# Patient Record
Sex: Female | Born: 1945 | Race: Black or African American | Hispanic: No | Smoking: Never smoker
Health system: Southern US, Community
[De-identification: ages and names within clinical notes are randomized; demographics above are authoritative.]

## PROBLEM LIST (undated history)

## (undated) DIAGNOSIS — J45909 Unspecified asthma, uncomplicated: Secondary | ICD-10-CM

## (undated) DIAGNOSIS — E119 Type 2 diabetes mellitus without complications: Secondary | ICD-10-CM

## (undated) DIAGNOSIS — I10 Essential (primary) hypertension: Secondary | ICD-10-CM

## (undated) DIAGNOSIS — G4730 Sleep apnea, unspecified: Secondary | ICD-10-CM

## (undated) DIAGNOSIS — M1990 Unspecified osteoarthritis, unspecified site: Secondary | ICD-10-CM

## (undated) HISTORY — PX: OTHER SURGICAL HISTORY: SHX169

---

## 2018-12-31 ENCOUNTER — Other Ambulatory Visit: Payer: Self-pay

## 2018-12-31 ENCOUNTER — Emergency Department (HOSPITAL_COMMUNITY): Payer: Medicare HMO

## 2018-12-31 ENCOUNTER — Inpatient Hospital Stay (HOSPITAL_COMMUNITY): Payer: Medicare HMO

## 2018-12-31 ENCOUNTER — Inpatient Hospital Stay (HOSPITAL_COMMUNITY)
Admission: EM | Admit: 2018-12-31 | Discharge: 2019-01-05 | DRG: 100 | Disposition: A | Discharge status: Home Health | Payer: Medicare HMO | Attending: Internal Medicine | Admitting: Internal Medicine

## 2018-12-31 DIAGNOSIS — I161 Hypertensive emergency: Secondary | ICD-10-CM

## 2018-12-31 DIAGNOSIS — G8194 Hemiplegia, unspecified affecting left nondominant side: Secondary | ICD-10-CM | POA: Diagnosis present

## 2018-12-31 DIAGNOSIS — I130 Hypertensive heart and chronic kidney disease with heart failure and stage 1 through stage 4 chronic kidney disease, or unspecified chronic kidney disease: Secondary | ICD-10-CM | POA: Diagnosis present

## 2018-12-31 DIAGNOSIS — R569 Unspecified convulsions: Secondary | ICD-10-CM | POA: Diagnosis not present

## 2018-12-31 DIAGNOSIS — E876 Hypokalemia: Secondary | ICD-10-CM | POA: Diagnosis present

## 2018-12-31 DIAGNOSIS — I5032 Chronic diastolic (congestive) heart failure: Secondary | ICD-10-CM | POA: Diagnosis present

## 2018-12-31 DIAGNOSIS — J9601 Acute respiratory failure with hypoxia: Secondary | ICD-10-CM | POA: Diagnosis present

## 2018-12-31 DIAGNOSIS — R531 Weakness: Secondary | ICD-10-CM

## 2018-12-31 DIAGNOSIS — Z9911 Dependence on respirator [ventilator] status: Secondary | ICD-10-CM

## 2018-12-31 DIAGNOSIS — D320 Benign neoplasm of cerebral meninges: Secondary | ICD-10-CM | POA: Diagnosis present

## 2018-12-31 DIAGNOSIS — J45909 Unspecified asthma, uncomplicated: Secondary | ICD-10-CM | POA: Diagnosis present

## 2018-12-31 DIAGNOSIS — G9341 Metabolic encephalopathy: Secondary | ICD-10-CM | POA: Diagnosis present

## 2018-12-31 DIAGNOSIS — G8384 Todd's paralysis (postepileptic): Secondary | ICD-10-CM | POA: Diagnosis present

## 2018-12-31 DIAGNOSIS — J690 Pneumonitis due to inhalation of food and vomit: Secondary | ICD-10-CM | POA: Diagnosis present

## 2018-12-31 DIAGNOSIS — M109 Gout, unspecified: Secondary | ICD-10-CM | POA: Diagnosis present

## 2018-12-31 DIAGNOSIS — E785 Hyperlipidemia, unspecified: Secondary | ICD-10-CM | POA: Diagnosis present

## 2018-12-31 DIAGNOSIS — N179 Acute kidney failure, unspecified: Secondary | ICD-10-CM | POA: Diagnosis present

## 2018-12-31 DIAGNOSIS — E1122 Type 2 diabetes mellitus with diabetic chronic kidney disease: Secondary | ICD-10-CM | POA: Diagnosis present

## 2018-12-31 DIAGNOSIS — J9811 Atelectasis: Secondary | ICD-10-CM | POA: Diagnosis present

## 2018-12-31 DIAGNOSIS — R404 Transient alteration of awareness: Secondary | ICD-10-CM | POA: Diagnosis not present

## 2018-12-31 DIAGNOSIS — Z20828 Contact with and (suspected) exposure to other viral communicable diseases: Secondary | ICD-10-CM | POA: Diagnosis present

## 2018-12-31 DIAGNOSIS — Z6841 Body Mass Index (BMI) 40.0 and over, adult: Secondary | ICD-10-CM

## 2018-12-31 DIAGNOSIS — R4182 Altered mental status, unspecified: Secondary | ICD-10-CM | POA: Diagnosis not present

## 2018-12-31 DIAGNOSIS — G9340 Encephalopathy, unspecified: Secondary | ICD-10-CM | POA: Diagnosis not present

## 2018-12-31 DIAGNOSIS — N183 Chronic kidney disease, stage 3 (moderate): Secondary | ICD-10-CM | POA: Diagnosis present

## 2018-12-31 DIAGNOSIS — E1165 Type 2 diabetes mellitus with hyperglycemia: Secondary | ICD-10-CM | POA: Diagnosis present

## 2018-12-31 DIAGNOSIS — G4733 Obstructive sleep apnea (adult) (pediatric): Secondary | ICD-10-CM | POA: Diagnosis present

## 2018-12-31 DIAGNOSIS — Z888 Allergy status to other drugs, medicaments and biological substances status: Secondary | ICD-10-CM

## 2018-12-31 DIAGNOSIS — E11649 Type 2 diabetes mellitus with hypoglycemia without coma: Secondary | ICD-10-CM | POA: Diagnosis not present

## 2018-12-31 DIAGNOSIS — Z720 Tobacco use: Secondary | ICD-10-CM

## 2018-12-31 DIAGNOSIS — I10 Essential (primary) hypertension: Secondary | ICD-10-CM

## 2018-12-31 DIAGNOSIS — G40901 Epilepsy, unspecified, not intractable, with status epilepticus: Principal | ICD-10-CM | POA: Diagnosis present

## 2018-12-31 HISTORY — DX: Unspecified asthma, uncomplicated: J45.909

## 2018-12-31 HISTORY — DX: Sleep apnea, unspecified: G47.30

## 2018-12-31 HISTORY — DX: Essential (primary) hypertension: I10

## 2018-12-31 HISTORY — DX: Unspecified osteoarthritis, unspecified site: M19.90

## 2018-12-31 HISTORY — DX: Type 2 diabetes mellitus without complications: E11.9

## 2018-12-31 LAB — I-STAT CHEM 8, ED
BUN: 18 mg/dL (ref 8–23)
Calcium, Ion: 1.14 mmol/L — ABNORMAL LOW (ref 1.15–1.40)
Chloride: 98 mmol/L (ref 98–111)
Creatinine, Ser: 1.4 mg/dL — ABNORMAL HIGH (ref 0.44–1.00)
Glucose, Bld: 336 mg/dL — ABNORMAL HIGH (ref 70–99)
HCT: 42 % (ref 36.0–46.0)
Hemoglobin: 14.3 g/dL (ref 12.0–15.0)
Potassium: 3.6 mmol/L (ref 3.5–5.1)
Sodium: 138 mmol/L (ref 135–145)
TCO2: 30 mmol/L (ref 22–32)

## 2018-12-31 LAB — COMPREHENSIVE METABOLIC PANEL
ALT: 16 U/L (ref 0–44)
AST: 22 U/L (ref 15–41)
Albumin: 3.7 g/dL (ref 3.5–5.0)
Alkaline Phosphatase: 98 U/L (ref 38–126)
Anion gap: 14 (ref 5–15)
BUN: 15 mg/dL (ref 8–23)
CO2: 27 mmol/L (ref 22–32)
Calcium: 9.2 mg/dL (ref 8.9–10.3)
Chloride: 97 mmol/L — ABNORMAL LOW (ref 98–111)
Creatinine, Ser: 1.57 mg/dL — ABNORMAL HIGH (ref 0.44–1.00)
GFR calc Af Amer: 38 mL/min — ABNORMAL LOW (ref >60)
GFR calc non Af Amer: 32 mL/min — ABNORMAL LOW (ref >60)
Glucose, Bld: 335 mg/dL — ABNORMAL HIGH (ref 70–99)
Potassium: 3.7 mmol/L (ref 3.5–5.1)
Sodium: 138 mmol/L (ref 135–145)
Total Bilirubin: 0.6 mg/dL (ref 0.3–1.2)
Total Protein: 8.1 g/dL (ref 6.5–8.1)

## 2018-12-31 LAB — POCT I-STAT 7, (LYTES, BLD GAS, ICA,H+H)
Acid-Base Excess: 3 mmol/L — ABNORMAL HIGH (ref 0.0–2.0)
Acid-base deficit: 1 mmol/L (ref 0.0–2.0)
Bicarbonate: 29.4 mmol/L — ABNORMAL HIGH (ref 20.0–28.0)
Bicarbonate: 28.6 mmol/L — ABNORMAL HIGH (ref 20.0–28.0)
Calcium, Ion: 1.19 mmol/L (ref 1.15–1.40)
Calcium, Ion: 1.16 mmol/L (ref 1.15–1.40)
HCT: 39 % (ref 36.0–46.0)
HCT: 35 % — ABNORMAL LOW (ref 36.0–46.0)
Hemoglobin: 13.3 g/dL (ref 12.0–15.0)
Hemoglobin: 11.9 g/dL — ABNORMAL LOW (ref 12.0–15.0)
O2 Saturation: 92 %
O2 Saturation: 95 %
Patient temperature: 99.7
Potassium: 4.7 mmol/L (ref 3.5–5.1)
Potassium: 3.9 mmol/L (ref 3.5–5.1)
Sodium: 139 mmol/L (ref 135–145)
Sodium: 140 mmol/L (ref 135–145)
TCO2: 32 mmol/L (ref 22–32)
TCO2: 30 mmol/L (ref 22–32)
pCO2 arterial: 75.4 mmHg (ref 32.0–48.0)
pCO2 arterial: 50.6 mmHg — ABNORMAL HIGH (ref 32.0–48.0)
pH, Arterial: 7.199 — CL (ref 7.350–7.450)
pH, Arterial: 7.364 (ref 7.350–7.450)
pO2, Arterial: 82 mmHg — ABNORMAL LOW (ref 83.0–108.0)
pO2, Arterial: 85 mmHg (ref 83.0–108.0)

## 2018-12-31 LAB — DIFFERENTIAL
Abs Immature Granulocytes: 0.06 10*3/uL (ref 0.00–0.07)
Basophils Absolute: 0.1 10*3/uL (ref 0.0–0.1)
Basophils Relative: 0 %
Eosinophils Absolute: 0.1 10*3/uL (ref 0.0–0.5)
Eosinophils Relative: 1 %
Immature Granulocytes: 1 %
Lymphocytes Relative: 26 %
Lymphs Abs: 3.4 10*3/uL (ref 0.7–4.0)
Monocytes Absolute: 0.6 10*3/uL (ref 0.1–1.0)
Monocytes Relative: 4 %
Neutro Abs: 9 10*3/uL — ABNORMAL HIGH (ref 1.7–7.7)
Neutrophils Relative %: 68 %

## 2018-12-31 LAB — MRSA PCR SCREENING: MRSA by PCR: NEGATIVE

## 2018-12-31 LAB — CBC
HCT: 40.6 % (ref 36.0–46.0)
Hemoglobin: 12.3 g/dL (ref 12.0–15.0)
MCH: 25.7 pg — ABNORMAL LOW (ref 26.0–34.0)
MCHC: 30.3 g/dL (ref 30.0–36.0)
MCV: 84.8 fL (ref 80.0–100.0)
Platelets: 227 10*3/uL (ref 150–400)
RBC: 4.79 MIL/uL (ref 3.87–5.11)
RDW: 14 % (ref 11.5–15.5)
WBC: 13.2 10*3/uL — ABNORMAL HIGH (ref 4.0–10.5)
nRBC: 0 % (ref 0.0–0.2)

## 2018-12-31 LAB — PROTIME-INR
INR: 1.1 (ref 0.8–1.2)
Prothrombin Time: 13.8 seconds (ref 11.4–15.2)

## 2018-12-31 LAB — URINALYSIS, ROUTINE W REFLEX MICROSCOPIC
Bilirubin Urine: NEGATIVE
Glucose, UA: >=500 mg/dL — AB
Ketones, ur: 5 mg/dL — AB
Nitrite: NEGATIVE
Protein, ur: 100 mg/dL — AB
Specific Gravity, Urine: 1.012 (ref 1.005–1.030)
pH: 6 (ref 5.0–8.0)

## 2018-12-31 LAB — RAPID URINE DRUG SCREEN, HOSP PERFORMED
Amphetamines: NOT DETECTED
Barbiturates: NOT DETECTED
Benzodiazepines: NOT DETECTED
Cocaine: NOT DETECTED
Opiates: NOT DETECTED
Tetrahydrocannabinol: NOT DETECTED

## 2018-12-31 LAB — BRAIN NATRIURETIC PEPTIDE: B Natriuretic Peptide: 235.4 pg/mL — ABNORMAL HIGH (ref 0.0–100.0)

## 2018-12-31 LAB — CBG MONITORING, ED
Glucose-Capillary: 294 mg/dL — ABNORMAL HIGH (ref 70–99)
Glucose-Capillary: 296 mg/dL — ABNORMAL HIGH (ref 70–99)

## 2018-12-31 LAB — SARS CORONAVIRUS 2 BY RT PCR (HOSPITAL ORDER, PERFORMED IN ~~LOC~~ HOSPITAL LAB): SARS Coronavirus 2: NEGATIVE

## 2018-12-31 LAB — GLUCOSE, CAPILLARY: Glucose-Capillary: 131 mg/dL — ABNORMAL HIGH (ref 70–99)

## 2018-12-31 LAB — APTT: aPTT: 28 seconds (ref 24–36)

## 2018-12-31 LAB — PROCALCITONIN
Procalcitonin: <0.1 ng/mL
Procalcitonin: 0.96 ng/mL

## 2018-12-31 LAB — ETHANOL: Alcohol, Ethyl (B): <10 mg/dL (ref <10)

## 2018-12-31 MED ORDER — INSULIN ASPART 100 UNIT/ML ~~LOC~~ SOLN
0.0000 [IU] | SUBCUTANEOUS | Status: DC
  Administered 2018-12-31: 5 [IU] via SUBCUTANEOUS
  Administered 2018-12-31: 1 [IU] via SUBCUTANEOUS
  Administered 2019-01-01: 7 [IU] via SUBCUTANEOUS
  Administered 2019-01-01: 2 [IU] via SUBCUTANEOUS
  Administered 2019-01-01: 3 [IU] via SUBCUTANEOUS

## 2018-12-31 MED ORDER — PROPOFOL 1000 MG/100ML IV EMUL
5.0000 ug/kg/min | INTRAVENOUS | Status: DC
  Administered 2018-12-31: 10 ug/kg/min via INTRAVENOUS

## 2018-12-31 MED ORDER — HEPARIN SODIUM (PORCINE) 5000 UNIT/ML IJ SOLN
5000.0000 [IU] | Freq: Three times a day (TID) | INTRAMUSCULAR | Status: DC
  Administered 2018-12-31 – 2019-01-05 (×14): 5000 [IU] via SUBCUTANEOUS
  Filled 2018-12-31 (×14): qty 1

## 2018-12-31 MED ORDER — DOCUSATE SODIUM 50 MG/5ML PO LIQD: 100.0000 mg | Freq: Two times a day (BID) | ORAL | Status: DC | PRN

## 2018-12-31 MED ORDER — LORAZEPAM 2 MG/ML IJ SOLN
INTRAMUSCULAR | Status: AC
  Administered 2018-12-31: 2 mg via INTRAMUSCULAR
  Filled 2018-12-31: qty 1

## 2018-12-31 MED ORDER — GADOBUTROL 1 MMOL/ML IV SOLN
10.0000 mL | Freq: Once | INTRAVENOUS | Status: AC | PRN
  Administered 2018-12-31: 10 mL via INTRAVENOUS

## 2018-12-31 MED ORDER — CHLORHEXIDINE GLUCONATE CLOTH 2 % EX PADS
6.0000 | MEDICATED_PAD | Freq: Every day | CUTANEOUS | Status: DC
  Administered 2018-12-31 – 2019-01-05 (×5): 6 via TOPICAL

## 2018-12-31 MED ORDER — ALBUTEROL SULFATE (2.5 MG/3ML) 0.083% IN NEBU
2.5000 mg | INHALATION_SOLUTION | RESPIRATORY_TRACT | Status: DC | PRN
  Administered 2019-01-01: 2.5 mg via RESPIRATORY_TRACT
  Filled 2018-12-31: qty 3

## 2018-12-31 MED ORDER — PANTOPRAZOLE SODIUM 40 MG PO TBEC
40.0000 mg | DELAYED_RELEASE_TABLET | Freq: Every day | ORAL | Status: DC
  Administered 2019-01-01 – 2019-01-05 (×5): 40 mg via ORAL
  Filled 2018-12-31 (×5): qty 1

## 2018-12-31 MED ORDER — ARFORMOTEROL TARTRATE 15 MCG/2ML IN NEBU
15.0000 ug | INHALATION_SOLUTION | Freq: Two times a day (BID) | RESPIRATORY_TRACT | Status: DC
  Administered 2018-12-31 – 2019-01-05 (×10): 15 ug via RESPIRATORY_TRACT
  Filled 2018-12-31 (×10): qty 2

## 2018-12-31 MED ORDER — PROPOFOL 1000 MG/100ML IV EMUL
INTRAVENOUS | Status: AC
  Filled 2018-12-31: qty 100

## 2018-12-31 MED ORDER — ETOMIDATE 2 MG/ML IV SOLN
INTRAVENOUS | Status: AC | PRN
  Administered 2018-12-31: 10 mg via INTRAVENOUS

## 2018-12-31 MED ORDER — FENTANYL CITRATE (PF) 100 MCG/2ML IJ SOLN: 25.0000 ug | INTRAMUSCULAR | Status: DC | PRN

## 2018-12-31 MED ORDER — CHLORHEXIDINE GLUCONATE 0.12% ORAL RINSE (MEDLINE KIT)
15.0000 mL | Freq: Two times a day (BID) | OROMUCOSAL | Status: DC
  Administered 2018-12-31 – 2019-01-01 (×2): 15 mL via OROMUCOSAL

## 2018-12-31 MED ORDER — ORAL CARE MOUTH RINSE
15.0000 mL | OROMUCOSAL | Status: DC
  Administered 2018-12-31 – 2019-01-01 (×5): 15 mL via OROMUCOSAL

## 2018-12-31 MED ORDER — HYDRALAZINE HCL 20 MG/ML IJ SOLN
10.0000 mg | INTRAMUSCULAR | Status: DC | PRN
  Administered 2018-12-31 – 2019-01-01 (×2): 40 mg via INTRAVENOUS
  Administered 2019-01-02: 10 mg via INTRAVENOUS
  Filled 2018-12-31: qty 1
  Filled 2018-12-31 (×2): qty 2

## 2018-12-31 MED ORDER — LACTATED RINGERS IV SOLN
INTRAVENOUS | Status: DC
  Administered 2018-12-31 – 2019-01-02 (×2): via INTRAVENOUS

## 2018-12-31 MED ORDER — BISACODYL 10 MG RE SUPP: 10.0000 mg | Freq: Every day | RECTAL | Status: DC | PRN

## 2018-12-31 MED ORDER — SUCCINYLCHOLINE CHLORIDE 20 MG/ML IJ SOLN
INTRAMUSCULAR | Status: AC | PRN
  Administered 2018-12-31: 120 mg via INTRAVENOUS

## 2018-12-31 MED ORDER — ACETAMINOPHEN 160 MG/5ML PO SOLN
650.0000 mg | Freq: Four times a day (QID) | ORAL | Status: DC | PRN
  Administered 2018-12-31: 21:00:00 650 mg
  Filled 2018-12-31: qty 20.3

## 2018-12-31 MED ORDER — SODIUM CHLORIDE 0.9 % IV SOLN
3.0000 g | Freq: Four times a day (QID) | INTRAVENOUS | Status: DC
  Administered 2018-12-31 – 2019-01-03 (×11): 3 g via INTRAVENOUS
  Filled 2018-12-31 (×14): qty 3

## 2018-12-31 MED ORDER — DEXMEDETOMIDINE HCL IN NACL 200 MCG/50ML IV SOLN
0.4000 ug/kg/h | INTRAVENOUS | Status: DC
  Administered 2018-12-31: 11:00:00 0.7 ug/kg/h via INTRAVENOUS
  Filled 2018-12-31 (×2): qty 50

## 2018-12-31 MED ORDER — BUDESONIDE 0.5 MG/2ML IN SUSP
0.5000 mg | Freq: Two times a day (BID) | RESPIRATORY_TRACT | Status: DC
  Administered 2018-12-31 – 2019-01-05 (×10): 0.5 mg via RESPIRATORY_TRACT
  Filled 2018-12-31 (×10): qty 2

## 2018-12-31 MED ORDER — LABETALOL HCL 5 MG/ML IV SOLN
10.0000 mg | INTRAVENOUS | Status: DC | PRN
  Administered 2018-12-31 – 2019-01-01 (×3): 10 mg via INTRAVENOUS
  Filled 2018-12-31 (×3): qty 4

## 2018-12-31 MED ORDER — SODIUM CHLORIDE 0.9 % IV SOLN
150.0000 mg | Freq: Three times a day (TID) | INTRAVENOUS | Status: DC
  Administered 2019-01-01 – 2019-01-02 (×6): 150 mg via INTRAVENOUS
  Filled 2018-12-31 (×15): qty 3

## 2018-12-31 MED ORDER — PROPOFOL 1000 MG/100ML IV EMUL
5.0000 ug/kg/min | INTRAVENOUS | Status: DC
  Administered 2018-12-31: 40 ug/kg/min via INTRAVENOUS
  Administered 2018-12-31 (×2): 50 ug/kg/min via INTRAVENOUS
  Administered 2018-12-31: 5 ug/kg/min via INTRAVENOUS
  Administered 2019-01-01: 50 ug/kg/min via INTRAVENOUS
  Administered 2019-01-01: 40 ug/kg/min via INTRAVENOUS
  Administered 2019-01-01: 50 ug/kg/min via INTRAVENOUS
  Filled 2018-12-31 (×7): qty 100

## 2018-12-31 MED ORDER — SODIUM CHLORIDE 0.9 % IV SOLN
1500.0000 mg | Freq: Once | INTRAVENOUS | Status: AC
  Administered 2018-12-31: 1500 mg via INTRAVENOUS
  Filled 2018-12-31: qty 30

## 2018-12-31 NOTE — ED Notes (Signed)
Nurse navigator spoke with daughter Sharon Stout updated patient waiting for admission bed. Verbalized understanding and thanked nurse for update.

## 2018-12-31 NOTE — ED Triage Notes (Signed)
Pt arrives to ED as a Code Stroke with [facility] from home with complaints of left sided gaze, left sided arm weakness, and left sided leg weakness. Pt's LKN was 2030 on 5/31. EMS sates on transport pt had a witnessed two minute seizure. EMS was unable to give medications due to no IV access. Pt was met at ED bridge by EDP, neurologist, stoke team and this RN. Pt rushed for stat CT scan.

## 2018-12-31 NOTE — Code Documentation (Signed)
73yo female arriving to [facility] via [place] at 56. Patient from home where she was [place] on 12/30/2018 at 2030. This morning her family attempted to get her up and she was weak on the left side. EMS called and activated a code stroke for left gaze and left hemiplegia. En route patient had a seizure lasting for ~2 minutes. Stroke team at the bedside on patient arrival. Labs drawn and patient cleared for CT by Dr. Jeanell Sparrow. Patient to CT with team. CT completed. While getting patient off the CT table patient had a seizure lasting ~1 minute. Patient transferred back to the ED where she was given Ativan IM d/t no IV access despite multiple attempts. A central line was placed and patient was intubated by EDP. NIHSS 34, see documentation for details and code stroke times. Patient nonverbal with forced left gaze and left hemiplegia on exam. Patient is outside the window for treatment with tPA. Treatment for status epilepticus. Patient to admit to ICU by CCM. Bedside handoff with ED RN Debe Coder.

## 2018-12-31 NOTE — Progress Notes (Signed)
EEG complete - results pending 

## 2018-12-31 NOTE — Progress Notes (Signed)
Spoke to Dr Jeanell Sparrow about tube placement prior to 2nd CXR. Pulled tube to 23. After 2nd CXR RT assessed ET tube and was at 25 ATL .Withdrew ET tube to 23 ATL .

## 2018-12-31 NOTE — Progress Notes (Signed)
Pharmacy Antibiotic Note  Sharon Stout is a 73 y.o. female admitted on 12/31/2018 with likely aspiration pneumonia.  Pharmacy has been consulted for Unasyn dosing. Patient Tmax 101.3 ,WBC 13.2, Scr 1.57>>1.4.   Plan: Start Unasyn 3g q6h Monitor renal function, culture data, clinical picture, LOT  Height: 5\' 4"  (162.6 cm) Weight: 282 lb 3 oz (128 kg) IBW/kg (Calculated) : 54.7  Temp (24hrs), Avg:98.4 F (36.9 C), Min:96.2 F (35.7 C), Max:101.3 F (38.5 C)  Recent Labs  Lab 12/31/18 0822 12/31/18 0834  WBC 13.2*  --   CREATININE 1.57* 1.40*    Estimated Creatinine Clearance: 47.5 mL/min (A) (by C-G formula based on SCr of 1.4 mg/dL (H)).    Allergies  Allergen Reactions  . Amlodipine Swelling  . Lisinopril Other (See Comments)    Tongue edema 01 Apr 2011 Tongue edema 01 Apr 2011     Antimicrobials this admission: Unasyn 6/1 >>   Thank you for involving pharmacy in this patient's care.  Janae Bridgeman, PharmD PGY1 Pharmacy Resident Phone: [phone] 12/31/2018 6:33 PM

## 2018-12-31 NOTE — Progress Notes (Signed)
eLink Physician-Brief Progress Note Patient Name: Marybel Alcott DOB: [birth year] MRN: [id number]   Date of Service  12/31/2018  HPI/Events of Note  Multiple issues: 1. Hypertension - BP = 183/88 and 2. Fever to 102.2 F. Presently has Unasyn ordered, but has not received dose.  AST and ALT both normal.  Note: allergy to amlodipine, therefore can't use Cardene or Cleviprex IV infusions.   eICU Interventions  Will order: 1. Hydralazine 10 mg IV Q 4 hours PRN SBP > 180 or DBP > 100. 2. Blood cultures X 2.  3. Please see that patient receives dose of Unasyn ordered for 6:45 PM. 4. Tylenol liquid 650 mg per tube Q 6 hours PRN Temp > 100.5 F.     Intervention Category Major Interventions: Infection - evaluation and management;Hypertension - evaluation and management  Lysle Dingwall 12/31/2018, 7:52 PM

## 2018-12-31 NOTE — ED Notes (Signed)
Critical Care MD paged ref. Pt's temp.  Waiting for call back

## 2018-12-31 NOTE — ED Notes (Signed)
vanessa patterson, patients daughter wanted to leave her number to get an update on patient: [phone]

## 2018-12-31 NOTE — ED Provider Notes (Signed)
[place] EMERGENCY DEPARTMENT Provider Note   CSN: [id number] Arrival date & time: 12/31/18  4081  An emergency department physician performed an initial assessment on this suspected stroke patient at 9.  History   Chief Complaint Chief Complaint  Patient presents with   Code Stroke    HPI Sharon Stout is a 73 y.o. female.    Level 5 caveat secondary to altered mental status HPI  73 year old female presents today via EMS as code stroke LVO positive.  Last known normal 20-30 last night.  History obtained via EMS she states that her daughter his family was at the bedside.  She reports she found her mother this morning with left-sided weakness.  EMS reports that the patient has been unresponsive with a left-sided gaze and flaccid left upper extremity.  They noted a 1 to 2-minute grand mal seizure in route.  They report that prehospital the patient had a blood sugar of approximately 300.  They were unable to obtain a prehospital blood pressure.  The patient has no history of strokes, seizure, or head injury.  No past medical history on file.  There are no active problems to display for this patient.  Past medical history is significant for hypertension, diabetes, and high blood pressure. OB History   No obstetric history on file.      Home Medications    Prior to Admission medications   Not on File    Family History No family history on file.  Social History Social History   Tobacco Use   Smoking status: Not on file  Substance Use Topics   Alcohol use: Not on file   Drug use: Not on file     Allergies   Patient has no known allergies.   Review of Systems Review of Systems  Unable to perform ROS: Acuity of condition     Physical Exam Updated Vital Signs BP (!) 240/93 (BP Location: Right Arm)    Pulse 96    Temp (!) 97.5 F (36.4 C) (Axillary)    Resp 15    SpO2 96%   Physical Exam Vitals signs and nursing note reviewed.    Constitutional:      General: She is not in acute distress.    Appearance: She is obese. She is ill-appearing.  HENT:     Head: Normocephalic and atraumatic.     Right Ear: External ear normal.     Left Ear: External ear normal.     Nose: Nose normal.     Mouth/Throat:     Mouth: Mucous membranes are moist.     Pharynx: No oropharyngeal exudate.  Eyes:     Comments: Eyes are deviated to the left  Neck:     Musculoskeletal: Normal range of motion.  Cardiovascular:     Rate and Rhythm: Normal rate.     Pulses: Normal pulses.  Pulmonary:     Comments: Patient with some sonerous respirations appears to be a normal rate and depth Abdominal:     Comments: Morbidly obese with soft abdomen  Musculoskeletal:        General: No swelling, deformity or signs of injury.  Skin:    General: Skin is warm and dry.     Capillary Refill: Capillary refill takes less than 2 seconds.  Neurological:     Comments: Patient responds with some moaning to name otherwise nonverbal Eyes are deviated to left Left upper extremity is flaccid She appears to have some tone to the  right upper extremity, right lower extremity, and left lower extremity      ED Treatments / Results  Labs (all labs ordered are listed, but only abnormal results are displayed) Labs Reviewed  CBC - Abnormal; Notable for the following components:      Result Value   WBC 13.2 (*)    MCH 25.7 (*)    All other components within normal limits  DIFFERENTIAL - Abnormal; Notable for the following components:   Neutro Abs 9.0 (*)    All other components within normal limits  I-STAT CHEM 8, ED - Abnormal; Notable for the following components:   Creatinine, Ser 1.40 (*)    Glucose, Bld 336 (*)    Calcium, Ion 1.14 (*)    All other components within normal limits  CBG MONITORING, ED - Abnormal; Notable for the following components:   Glucose-Capillary 294 (*)    All other components within normal limits  PROTIME-INR  APTT   ETHANOL  COMPREHENSIVE METABOLIC PANEL  RAPID URINE DRUG SCREEN, HOSP PERFORMED  URINALYSIS, ROUTINE W REFLEX MICROSCOPIC    EKG EKG Interpretation  Date/Time:  Monday December 31 2018 08:50:05 EDT Ventricular Rate:  87 PR Interval:    QRS Duration: 94 QT Interval:  376 QTC Calculation: 453 R Axis:   56 Text Interpretation:  Normal sinus rhythm Premature atrial complexes Non-specific ST-t changes No old tracing to compare Confirmed by Pattricia Boss [phone]) on 12/31/2018 9:43:14 AM   Radiology Ct Head Code Stroke Wo Contrast  Result Date: 12/31/2018 CLINICAL DATA:  Code stroke. 73 year old female with left side weakness and gaze, last seen normal 2030 hours. EXAM: CT HEAD WITHOUT CONTRAST TECHNIQUE: Contiguous axial images were obtained from the base of the skull through the vertex without intravenous contrast. COMPARISON:  None. FINDINGS: Brain: Round left para falcine calcified dura or meningioma measures about 17 millimeters diameter with no significant mass effect and no associated edema. Asymmetry of the lateral ventricles, but no midline shift or other evidence of intracranial mass effect. No acute intracranial hemorrhage identified. Gray-white matter differentiation remains normal. No changes of acute cortically based infarct identified. Vascular: Calcified atherosclerosis at the skull base. Intracranial ectasias. No suspicious intracranial vascular hyperdensity. Skull: No acute osseous abnormality identified. Sinuses/Orbits: Paranasal sinuses and mastoids are well pneumatized. Other: Leftward gaze deviation. No other acute orbit or scalp soft tissue finding. ASPECTS [facility] Stroke Program Early CT Score) - Ganglionic level infarction (caudate, lentiform nuclei, internal capsule, insula, M1-M3 cortex): 7 - Supraganglionic infarction (M4-M6 cortex): 3 Total score (0-10 with 10 being normal): 10 IMPRESSION: 1. No acute cortically based infarct or acute intracranial hemorrhage identified.  ASPECTS is 10. 2. 17 mm left para falcine dural calcification or calcified meningioma with no edema or significant mass effect. 3. These results were communicated to Dr. Lorraine Lax at 8:51 amon 12/31/2018 by text page via the [facility] messaging system. Electronically Signed   By: Genevie Ann M.D.   On: 12/31/2018 08:52    Procedures .Central Line Date/Time: 12/31/2018 9:34 AM Performed by: Pattricia Boss, MD Authorized by: Pattricia Boss, MD   Consent:    Consent obtained:  Emergent situation   Alternatives discussed:  No treatment Pre-procedure details:    Hand hygiene: Hand hygiene performed prior to insertion     All elements of maximal sterile barrier technique followed: Mask, facial, hand hygiene, sterile gloves, and sterile sheath in place No gown used due to acuity of situation and need for IV access.     Skin preparation:  2% chlorhexidine   Skin preparation agent: Skin preparation agent completely dried prior to procedure   Anesthesia (see MAR for exact dosages):    Anesthesia method:  None Procedure details:    Location:  R femoral   Patient position:  Flat   Procedural supplies:  Triple lumen   Landmarks identified: yes     Ultrasound guidance: no     Number of attempts:  1   Successful placement: yes   Post-procedure details:    Post-procedure:  Dressing applied and line sutured   Assessment:  Blood return through all ports and free fluid flow   Patient tolerance of procedure:  Tolerated well, no immediate complications Procedure Name: Intubation Date/Time: 12/31/2018 9:35 AM Performed by: Pattricia Boss, MD Pre-anesthesia Checklist: Patient identified, Emergency Drugs available, Suction available, Patient being monitored and Timeout performed Oxygen Delivery Method: Non-rebreather mask Preoxygenation: Pre-oxygenation with 100% oxygen Induction Type: Rapid sequence Laryngoscope Size: Glidescope and 4 Tube size: 8.0 mm Number of attempts: 1 Airway Equipment and Method: Patient  positioned with wedge pillow and Video-laryngoscopy Placement Confirmation: ETT inserted through vocal cords under direct vision,  Positive ETCO2,  CO2 detector and Breath sounds checked- equal and bilateral Secured at: 25 cm Tube secured with: ETT holder Dental Injury: Teeth and Oropharynx as per pre-operative assessment     .Critical Care Performed by: Pattricia Boss, MD Authorized by: Pattricia Boss, MD   Critical care provider statement:    Critical care time (minutes):  75   Critical care was necessary to treat or prevent imminent or life-threatening deterioration of the following conditions:  CNS failure or compromise and respiratory failure   Critical care was time spent personally by me on the following activities:  Discussions with consultants, evaluation of patient's response to treatment, examination of patient, ordering and performing treatments and interventions, ordering and review of laboratory studies, ordering and review of radiographic studies, pulse oximetry, re-evaluation of patient's condition, obtaining history from patient or surrogate, review of old charts, development of treatment plan with patient or surrogate and vascular access procedures   10:02 AM Patient initially with decreased breath sounds on left and tube pulled back 1 cm with bilateral breath sounds fine radiology called and patient with right mainstem intubation Respiratory therapy contacted to have tube pulled back Medications Ordered in ED Medications  LORazepam (ATIVAN) 2 MG/ML injection (has no administration in time range)  fosPHENYtoin (CEREBYX) 20 mg PE/kg in sodium chloride 0.9 % 50 mL IVPB (has no administration in time range)  propofol (DIPRIVAN) 1000 MG/100ML infusion (has no administration in time range)  etomidate (AMIDATE) injection (10 mg Intravenous Given 12/31/18 0910)  succinylcholine (ANECTINE) injection (120 mg Intravenous Given 12/31/18 0911)     Initial Impression / Assessment and  Plan / ED Course  I have reviewed the triage vital signs and the nursing notes.  Pertinent labs & imaging results that were available during my care of the patient were reviewed by me and considered in my medical decision making (see chart for details).       Patient presented as code stroke with witnessed seizure and left eye deviation as well as left upper extremity flaccidity She was met at the bridge in conjunction with neurology as code stroke No IV access was in place but appeared to be in pain airway and opted to proceed directly to CT scan Patient had repeat seizure while in CT scan On return to ED, patient continued with difficult IV access Right femoral line placed Patient  intubated here in ED Plan medications per neurology recommendation and will plan IV antihypertensives Critical care consulted regarding admission Pharmacy consulted to reconcile medications as no medicines are listed Post intubation patient remains with critically high blood pressure.  Propofol initiated Currently propofol being initiated and will reevaluate vital signs in 15 minutes We will proceed of Cleviprex if remains significantly hypertensive   9:30 AM Discussed with daughter.  Daughter states that patient was in her usual state of health when the daughter went to bed approximately 9 PM last night.  She called to the daughter morning complaining of headache.  Daughter noted that she seemed confused and had increasing left arm weakness during that time.  Daughter states she had a similar episode last May and was treated at [facility].  Daughter states at that time was thought to be due to her blood pressure and patient symptoms improved after blood pressure control. Discussed sedation and hypertension with pharmacist.  Patient's located by tachycardia and removal of the clonidine patch.  Pharmacy advises that Precedex may be a better choice of sedation as there is some alpha agonist activity.   However, now at 1030, patient's blood pressure is at 139 after ET tube being pulled back.  Do not feel that further blood pressure management is currently indicated.  Will continue propofol and hold Precedex at this time. Final Clinical Impressions(s) / ED Diagnoses   Final diagnoses:  Altered mental status, unspecified altered mental status type  Left-sided weakness  Hypertension, unspecified type    ED Discharge Orders    None       Pattricia Boss, MD 12/31/18 1056

## 2018-12-31 NOTE — Consult Note (Addendum)
Requesting Physician: Dr. Jeanell Sparrow    Chief Complaint: left side weakness, gaze deviation  History obtained from: EMS and  Chart    HPI:                                                                                                                                       Sharon Stout is a 73 y.o. female with PMH of HTN, DM, CHF, gout, OSA on CPAP presents to the ED as a code stroke after family noted her to have fallen out of bed. LSN was 8.30 pm last night. She was noted to be plegic on the left side with left gaze by EMS.  En route she had had 2 min seizure- spontaneously resolved.   On arrival the patient was lethargic with gaze deviation to the left and left-sided weakness.Marland Kitchen  She was saturating at 85%.   Blood pressure was greater than 485 systolic on arrival and glucose was greater than 300. We obtained a stat CT head to rule out hemorrhage, showed no acute findings.  CT demonstrated a left parafalcine meningioma.  Shortly after obtaining CT patient had another seizure generalized tonic-clonic lasting approximately 1 minute which spontaneously resolved. 2 mg of Ativan was administered and patient was intubated as she was not protecting her airway.  Fosphenytoin 20 mg per KG was ordered.  Patient did not receive IV TPA as she was outside the window from last known normal and less likely to be a stroke.  CT angiogram not performed due to impaired renal function and given gaze deviation on the same side of left hemiparesis, represents seizure rather than a large vessel stroke.  Past medical history -As noted above  No family history on file.  No past medical history relevant patient's presentation Social History:  has no history on file for tobacco, alcohol, and drug.  Allergies:  Allergies  Allergen Reactions  . Amlodipine Swelling  . Lisinopril Other (See Comments)    Tongue edema 01 Apr 2011 Tongue edema 01 Apr 2011     Medications:                                                                                                                         I reviewed home medications    ROS:  Unable to review systems.   Examination:                                                                                                      General: Appears well-developed.  Obese Psych: Affect appropriate to situation Eyes: No scleral injection HENT: No OP obstrucion Head: Normocephalic.  Cardiovascular: Normal rate and regular rhythm.  Respiratory: Effort normal and breath sounds normal to anterior ascultation GI: Soft.  No distension. There is no tenderness.  Skin: WDI    Neurological Examination Mental Status: Lethargic, not following commands. Nonverbal. Groans to painful stimulus Cranial Nerves: II: pupils equal and reactive III,IV,VI: forced gaze deviation to left side VIII: patient does not respond to verbal stimuli IX,X: gag reflex present   Motor: Right arm and leg moving spontaneously, left arm and leg 0/5 strength  Sensory: Does not respond to noxious stimuli on the left side Plantars: flexor bilaterally Cerebellar: Unable to perform     Lab Results: Basic Metabolic Panel: Recent Labs  Lab 12/31/18 0822 12/31/18 0834 12/31/18 1125  NA 138 138 139  K 3.7 3.6 4.7  CL 97* 98  --   CO2 27  --   --   GLUCOSE 335* 336*  --   BUN 15 18  --   CREATININE 1.57* 1.40*  --   CALCIUM 9.2  --   --     CBC: Recent Labs  Lab 12/31/18 0822 12/31/18 0834 12/31/18 1125  WBC 13.2*  --   --   NEUTROABS 9.0*  --   --   HGB 12.3 14.3 13.3  HCT 40.6 42.0 39.0  MCV 84.8  --   --   PLT 227  --   --     Coagulation Studies: Recent Labs    12/31/18 0822  LABPROT 13.8  INR 1.1    Imaging: Dg Chest Portable 1 View  Result Date: 12/31/2018 CLINICAL DATA:  Endotracheal tube readjustment EXAM: PORTABLE CHEST 1 VIEW  COMPARISON:  December 31, 2018 study obtained earlier in the day FINDINGS: Endotracheal tube tip abuts the carina. Orogastric tube tip and side port below the diaphragm with the side port noted in the stomach. No pneumothorax. There are small pleural effusions bilaterally with bibasilar atelectasis. Heart size within normal limits. No adenopathy. There is aortic atherosclerosis. No bone lesions. IMPRESSION: Endotracheal tube abuts the carina. Advise withdrawing endotracheal tube approximately 3-3.5 cm. Orogastric tube tip and side port below diaphragm. Bibasilar atelectasis with small pleural effusions bilaterally. Stable cardiac silhouette. Aortic Atherosclerosis (ICD10-I70.0). Electronically Signed   By: Lowella Grip III M.D.   On: 12/31/2018 10:32   Dg Chest Portable 1 View  Result Date: 12/31/2018 CLINICAL DATA:  Endotracheal and enteric tube placement. EXAM: PORTABLE CHEST 1 VIEW COMPARISON:  None. FINDINGS: The patient is rotated to the left. Endotracheal tube in the right mainstem bronchus. Recommend retracting 4 cm. Enteric tube in the stomach with the tip below the field of view. Mild cardiomegaly. Low lung volumes are present, causing crowding of the pulmonary vasculature. Small bilateral pleural effusions. Patchy opacity at  the left lung base. No pneumothorax. No acute osseous abnormality. IMPRESSION: 1. Endotracheal tube in the right mainstem bronchus. Recommend retracting 4 cm. 2. Small bilateral pleural effusions. Left lower lobe atelectasis versus infiltrate. Critical Value/emergent results were called by telephone at the time of interpretation on 12/31/2018 at 10:02 am to Dr. Pattricia Boss , who verbally acknowledged these results. Electronically Signed   By: Titus Dubin M.D.   On: 12/31/2018 10:02   Ct Head Code Stroke Wo Contrast  Result Date: 12/31/2018 CLINICAL DATA:  Code stroke. 73 year old female with left side weakness and gaze, last seen normal 2030 hours. EXAM: CT HEAD WITHOUT  CONTRAST TECHNIQUE: Contiguous axial images were obtained from the base of the skull through the vertex without intravenous contrast. COMPARISON:  None. FINDINGS: Brain: Round left para falcine calcified dura or meningioma measures about 17 millimeters diameter with no significant mass effect and no associated edema. Asymmetry of the lateral ventricles, but no midline shift or other evidence of intracranial mass effect. No acute intracranial hemorrhage identified. Gray-white matter differentiation remains normal. No changes of acute cortically based infarct identified. Vascular: Calcified atherosclerosis at the skull base. Intracranial ectasias. No suspicious intracranial vascular hyperdensity. Skull: No acute osseous abnormality identified. Sinuses/Orbits: Paranasal sinuses and mastoids are well pneumatized. Other: Leftward gaze deviation. No other acute orbit or scalp soft tissue finding. ASPECTS [facility] Stroke Program Early CT Score) - Ganglionic level infarction (caudate, lentiform nuclei, internal capsule, insula, M1-M3 cortex): 7 - Supraganglionic infarction (M4-M6 cortex): 3 Total score (0-10 with 10 being normal): 10 IMPRESSION: 1. No acute cortically based infarct or acute intracranial hemorrhage identified. ASPECTS is 10. 2. 17 mm left para falcine dural calcification or calcified meningioma with no edema or significant mass effect. 3. These results were communicated to Dr. Lorraine Lax at 8:51 amon 12/31/2018 by text page via the [facility] messaging system. Electronically Signed   By: Genevie Ann M.D.   On: 12/31/2018 08:52     I have reviewed the above imaging    ASSESSMENT AND PLAN  73 y.o. female with PMH of HTN, DM, CHF, gout, OSA on CPAP presents to the emergency department with left-sided weakness and left gaze deviation with multiple seizures without return to baseline. Stat CT head ruled out pontine hemorrhage.  Showed a parafalcine falcine meningioma on the left side, which is likely incidental.   Neurology for her seizures is unknown at this point.  Patient did present with a very high blood pressure greater than 564 systolic so PRES is a consideration.  Has been afebrile, no significant leukocytosis, no neck stiffness so less likely to be encephalitis/meningitis.  It is possible that she may have had a embolic strokes but not a candidate for tPA.  Status epilepticus Todd's paresis Acute respiratory failure due to status epilepticus Hypertensive emergency.   Recommendations MRI Brain w/wo contrast to look for stroke/PRES/seizure focus to explain patients presentation of status epilepticus.  Stat EEG obtained negative for nonconvulsive status epilepticus/seizures Continue Dilantin, appreciate pharmacy assistance in maintenance dosing, follow up levels  Will defer LP for now  as patient afebrile, no neck stiffness and low suspicion for meningitis/encephalitis  Wean sedation as tolerated BP goal less than 180/110 mmHg Correct hyperglycemia   Patient is neurologically critically ill due to status epilepticus requiring intubation.  Patient is high risk of worsening neurological outcome due to worsening seizures, respirator failure, cardiac arrest and other complications of intubation and ICU stay.   I spent 60 minutes of neuro critical care time with  this patient including initial assessmet, diagnosis and evaluation.  Sharon Stout [facility] Pager Number [phone]

## 2018-12-31 NOTE — ED Notes (Signed)
Pt back in room from CT. Attempting to get IV unsuccessful by multiple RN's. MD Ray at bedside to attempt central line.

## 2018-12-31 NOTE — ED Notes (Signed)
Respiratory at bedside to pull back ET tube

## 2018-12-31 NOTE — ED Notes (Signed)
2mg  ativan given IM per Dr. Leona Singleton verbal order

## 2018-12-31 NOTE — H&P (Addendum)
NAME:  Sharon Stout, MRN:  [id number], DOB:  16-Jan-1946, LOS: 0 ADMISSION DATE:  12/31/2018, CONSULTATION DATE:  12/31/2018 REFERRING MD:  Dr. Jeanell Sparrow, CHIEF COMPLAINT:  Stroke/ SE  Brief History    73 year old female presenting from home LSW around 2100 last night, 5/31. Found this morning with new confusion and left sided gaze and weakness.  S/P three witnessed seizures concerning for status epilepticus and noted to be hypertensive.  Required emergent intubation and placement of femoral central line.  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  Neurology consulting, PCCM to admit.   History of present illness   HPI obtained from medical chart review as patient is intubated and sedated.   73 year old female with prior history of asthma, diastolic HF, HTN, HLD, DMT2, gout, OSA on CPAP, and osteoarthritis presenting from home LSW around 2100 last night (5/31).    Patient lives with her daughter, Sharon Stout.  She states patient has complained of in  intermittent headache for weeks.  She was changed from clonidine pill to clonidine patch last week.  Daughter states she is compliant with her other medications.  Denies ETOH or drug abuse.  Patient does chew tobacco.  Daughter also reports patient recently had a tooth pulled and completed PCN abx.   She woke-up this morning complaining of worsening headache.  Daughter states she vomited 2-3 times, bilious/ non-bloody.  She also noticed she had a left gaze but really didn't notice any specific weakness given that she was able to stand.  Her daughter thought she seems confused and checked her blood pressure and found that it was high and called EMS given her symptoms were very similar to those in May 2019. Was admitted in May 2019 at [facility] with PRES.  Recently started on clonidine patch for her blood pressure.    She reportedly was found with left gaze and left sided weakness with EMS with witnessed tonic clonic seizure that abated on its own lasting for 1-2 minutes. Glucose  300.  Initial blood pressure in ER was 240/93.  Was taken for emergent head CT where she had a second tonic clonic seizure. Given poor/ no IV access was given IM Ativan and emergent central placed. Neurology seeing, fosphenytoin ordered given initial concern for stroke versus status epilepticus.  She remained altered and hypoxic unable to protect airway and therefore intubated. Initially sedated on propofol but was switched to precedex.  Labs noted for sCr 1.57, WBC 13.2, negative ETOH,  PCCM called for admission.   Past Medical History  HTN, DM, diastolic HF, gout, asthma, CKD stage 3, HLD, obesity, osteoarthritis, OSA on CPAP  Significant Hospital Events   6/1 Admitted   Consults:  Neurology   Procedures:  6/1 ETT >> 6/1 R femoral CVL >>  Significant Diagnostic Tests:  6/1 CTH >> 1. No acute cortically based infarct or acute intracranial hemorrhage identified. ASPECTS is 10. 2. 17 mm left para falcine dural calcification or calcified meningioma with no edema or significant mass effect.  EEG  Micro Data:  6/1 SARS coronavirus >> neg  Antimicrobials:   Interim history/subjective:  On precedex 0.8 mcg/kg/kmin No meds given for SBP thus far  Objective   Blood pressure (!) 189/84, pulse (!) 122, temperature (!) 97.3 F (36.3 C), resp. rate 18, height 5\' 4"  (1.626 m), weight 128 kg, SpO2 100 %.    Vent Mode: PRVC FiO2 (%):  [100 %] 100 % Set Rate:  [16 bmp] 16 bmp Vt Set:  [  440 mL] 440 mL PEEP:  [5 cmH20] 5 cmH20 Plateau Pressure:  [31 cmH20] 31 cmH20   Intake/Output Summary (Last 24 hours) at 12/31/2018 1021 Last data filed at 12/31/2018 1004 Gross per 24 hour  Intake 50 ml  Output -  Net 50 ml   Filed Weights   12/31/18 0920  Weight: 128 kg    Examination: General:  Critically ill older AA female sedated on MV HEENT: MM pink/moist, ETT at 23, OGT, pupils 4/reactive Neuro:  Does not open eyes or follow commands.  Occasionally coughing and spont moving extremities -  somewhat weaker on left  CV: rr, no mumur PULM: even/non-labored, lungs bilaterally rhonchi, not breathing over set rate of 16 GI: obese, soft, ND, hyperBS Extremities: warm/dry, trace LE edema  Skin: no rashes   Resolved Hospital Problem list    Assessment & Plan:   Acute encephalopathy with 3 witnessed seizures concerning for SE +/- PRES P:  Neuro ICU admit [place] [facility] assistance EEG pending AED per Neurology MRI brain ordered UDS pending SBP goals as below  PAD protocol with precedex and prn fentanyl, may need to switch to propofol pending EEG if burst suppression is needed  Ongoing neuro exams   Hypertensive Emergency Hx diastolic HF P:  Tele monitoring Goal SBP reduction 20-25% which would be around SBP 180 goal- with avoidance to drop SBP abruptly  Prn labetalol, may need to add cardene if ongoing hypertension Hold home lasix, metoprolol, clonidine for now for acute BP management  Acute Hypoxic Respiratory insufficiency Hx Asthma/ OSA on CPAP - ETT since retracted from initial.  CXR shows residual small bilateral pleural effusions/ atelectasis P:  Full MV support PRVC 8 cc/kg, rate 16 ABG noted- will increase rate to 24, recheck ABG around 1230 VAP measures Albuterol prn  Continue home brovanna/ pulmicort in lieu of symbicort  Daily SBT/ WUA after neurology clearance / pending EEG   At risk for AKI- is at baseline with CKD stage 3 - sCr 1.56 in 11/2017 P:  LR at 50 ml/hr Trend BMP / urinary output Bladder scan prn/ monitor for foley needs  Replace electrolytes as indicated   DMT2 P:  CBG q 4 SSI sensitive while NPO  HLD P:  Continue pravastatin    Leukocytosis - afebrile - no obvious infiltrate on CXR, but high risk of aspiration event given ongoing seizures/ hx vomiting  P:  Monitor clinically for now/ trend WBC/ fever curve Check PCT  Will need to establish PIV x 2 or place IJ CVL to d/c femoral CVL  Best practice:  Diet: NPO  Pain/Anxiety/Delirium protocol (if indicated): Precedex/ prn fentanyl VAP protocol (if indicated): yes DVT prophylaxis: heparin SQ/ SCD GI prophylaxis: PPI Glucose control: CBG q 4 Mobility: BR Code Status: Full  Family Communication: Daughter Sharon Stout [phone] called/ updated by phone Disposition: ICU   Labs   CBC: Recent Labs  Lab 12/31/18 0822 12/31/18 0834  WBC 13.2*  --   NEUTROABS 9.0*  --   HGB 12.3 14.3  HCT 40.6 42.0  MCV 84.8  --   PLT 227  --     Basic Metabolic Panel: Recent Labs  Lab 12/31/18 0822 12/31/18 0834  NA 138 138  K 3.7 3.6  CL 97* 98  CO2 27  --   GLUCOSE 335* 336*  BUN 15 18  CREATININE 1.57* 1.40*  CALCIUM 9.2  --    GFR: Estimated Creatinine Clearance: 47.5 mL/min (A) (by C-G formula based on SCr of 1.4  mg/dL (H)). Recent Labs  Lab 12/31/18 0822  WBC 13.2*    Liver Function Tests: Recent Labs  Lab 12/31/18 0822  AST 22  ALT 16  ALKPHOS 98  BILITOT 0.6  PROT 8.1  ALBUMIN 3.7   No results for input(s): LIPASE, AMYLASE in the last 168 hours. No results for input(s): AMMONIA in the last 168 hours.  ABG    Component Value Date/Time   TCO2 30 12/31/2018 0834     Coagulation Profile: Recent Labs  Lab 12/31/18 0822  INR 1.1    Cardiac Enzymes: No results for input(s): CKTOTAL, CKMB, CKMBINDEX, TROPONINI in the last 168 hours.  HbA1C: No results found for: HGBA1C  CBG: Recent Labs  Lab 12/31/18 0824  GLUCAP 294*    Review of Systems:   unable  Past Medical History  She,  has no past medical history on file.   Surgical History   unable  Social History    Chews tobacco No reported ETOH/ illicit drug abuse  Family History   Her family history is not on file.   Allergies Allergies  Allergen Reactions  . Amlodipine Swelling  . Lisinopril Other (See Comments)    Tongue edema 01 Apr 2011 Tongue edema 01 Apr 2011      Home Medications  Prior to Admission medications   Medication Sig Start  Date End Date Taking? Authorizing Provider  acetaminophen (TYLENOL) 500 MG tablet Take 500 mg by mouth as needed.   Yes [provider]  allopurinol (ZYLOPRIM) 100 MG tablet Take 100 mg by mouth daily.  02/14/17  Yes [provider]  aspirin EC 81 MG tablet Take 81 mg by mouth daily.    Yes [provider]  budesonide-formoterol (SYMBICORT) 160-4.5 MCG/ACT inhaler Inhale 2 puffs into the lungs 2 (two) times a day. 11/07/18  Yes [provider]  cloNIDine (CATAPRES - DOSED IN MG/24 HR) 0.3 mg/24hr patch Place 0.03 mg onto the skin once a week. Thursday   Yes [provider]  Dulaglutide (TRULICITY) 1.61 WR/6.0AV SOPN Inject 0.75 mg into the skin once a week. Friday 02/03/17  Yes [provider]  fluticasone (FLONASE) 50 MCG/ACT nasal spray Place 1 spray into both nostrils as needed. 02/14/17  Yes [provider]  furosemide (LASIX) 40 MG tablet Take 40 mg by mouth daily. 10/29/18  Yes [provider]  metoprolol succinate (TOPROL-XL) 50 MG 24 hr tablet Take 50 mg by mouth daily. 02/14/17  Yes [provider]  pravastatin (PRAVACHOL) 20 MG tablet Take 20 mg by mouth at bedtime. 08/29/16  Yes [provider]  traMADol (ULTRAM) 50 MG tablet Take 50 mg by mouth 2 (two) times a day. 11/07/18  Yes [provider]  cloNIDine (CATAPRES) 0.2 MG tablet Take 0.2 mg by mouth 3 (three) times daily. 11/12/18   [provider]     Critical care time: 50 mins    Kennieth Rad, MSN, AGACNP-BC [facility] Pgr: [phone] or if no answer [phone] 12/31/2018, 12:04 PM   PCCM attending:  73 year old female found down this morning confused left-sided gaze and weakness concern for stroke initially presented as a stroke alert.  In transport from EMS had witnessed grand mal seizure as well as 2 additional witnessed seizures in the emergency room.  Patient was emergently intubated placed on mechanical  life support and a femoral central line was placed for vascular access.  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  Neurology has seen the patient in the emergency room and a stat EEG has been performed which reveals a burst suppression following loading of fosphenytoin.  Additionally the patient seizure was abated with IV Ativan.  BP (!) 86/50   Pulse 93   Temp 98.1 F (36.7 C)   Resp (!) 8   Ht 5\' 4"  (1.626 m)   Wt 128 kg   SpO2 99%   BMI 48.44 kg/m   Gen: Elderly chronically ill-appearing female intubated on mechanical life support HEENT: NCAT, sclera clear pupils reactive-no longer has lateral gaze Neck: Large endotracheal tube in place Chest: Regular rate rhythm S1-S2, no MRG Lungs: Bilateral ventilated breath sounds Neuro: Cough gag present, pupils reactive, spontaneous movements of all 4 extremities, will not follow commands, withdraws to pain.  Labs: Reviewed Arterial blood gas: 7.199/75/82/29  COVID SARS-CoV-2 PCR negative  Chest x-ray reviewed: No significant infiltrate, bilateral basilar atelectasis possible small effusion, enlarged cardiac silhouette The patient's images have been independently reviewed by me.    A: Acute hypoxemic hypercarbic respiratory failure requiring intubation and mechanical ventilation secondary to status epilepticus, acute metabolic encephalopathy and inability to protect her airway. History of posterior reversible encephalopathy syndrome, pres Acute respiratory acidosis  Hypertensive emergency Chronic diastolic heart failure History of asthma, OSA on CPAP Morbid obesity Baseline CKD Type 2 diabetes Leukocytosis, likely reactive  P: Agree with admission to the neuro intensive care unit Patient remains on mechanical ventilation I increased the patients RR to increase their minute ventilation for better CO2 removal  We will slowly start to reduce her systolic blood  pressure Patient has been loaded with fosphenytoin PRN Ativan for seizure-like activity Additional AED regimen per neurology Continuous EEG monitoring if needed per neurology Sedation guidelines with Precedex and fentanyl, suspect we will need to switch to propofol if needed for burst suppression. Can use PRN labetalol for systolic blood pressures greater than 180. Follow urine output and replace electrolytes as needed. CBGs every 4 with SSI.  This patient is critically ill with multiple organ system failure; which, requires frequent high complexity decision making, assessment, support, evaluation, and titration of therapies. This was completed through the application of advanced monitoring technologies and extensive interpretation of multiple databases. During this encounter critical care time was devoted to patient care services described in this note for 36 minutes.   Garner Nash, DO [facility] 12/31/2018 1:34 PM  Personal pager: [phone] If unanswered, please page CCM On-call: [phone]

## 2018-12-31 NOTE — Progress Notes (Addendum)
MEDICATION RELATED CONSULT NOTE - INITIAL   Pharmacy Consult:  Dilantin Indication:  Seizure  Allergies  Allergen Reactions  . Amlodipine Swelling  . Lisinopril Other (See Comments)    Tongue edema 01 Apr 2011 Tongue edema 01 Apr 2011     Patient Measurements: Height: 5\' 4"  (162.6 cm) Weight: 282 lb 3 oz (128 kg) IBW/kg (Calculated) : 54.7 Adjusted Body Weight: 84 kg  Vital Signs: Temp: 98.1 F (36.7 C) (06/01 1315) Temp Source: Axillary (06/01 0850) BP: 177/89 (06/01 1315) Pulse Rate: 101 (06/01 1315) Intake/Output from previous day: No intake/output data recorded. Intake/Output from this shift: Total I/O In: 50 [IV Piggyback:50] Out: -   Labs: Recent Labs    12/31/18 0822 12/31/18 0834 12/31/18 1125  WBC 13.2*  --   --   HGB 12.3 14.3 13.3  HCT 40.6 42.0 39.0  PLT 227  --   --   APTT 28  --   --   CREATININE 1.57* 1.40*  --   ALBUMIN 3.7  --   --   PROT 8.1  --   --   AST 22  --   --   ALT 16  --   --   ALKPHOS 98  --   --   BILITOT 0.6  --   --    Estimated Creatinine Clearance: 47.5 mL/min (A) (by C-G formula based on SCr of 1.4 mg/dL (H)).   Microbiology: Recent Results (from the past 720 hour(s))  SARS Coronavirus 2 (CEPHEID - Performed in [place] hospital lab), Hosp Order     Status: None   Collection Time: 12/31/18  9:41 AM  Result Value Ref Range Status   SARS Coronavirus 2 NEGATIVE NEGATIVE Final    Comment: (NOTE) If result is NEGATIVE SARS-CoV-2 target nucleic acids are NOT DETECTED. The SARS-CoV-2 RNA is generally detectable in upper and lower  respiratory specimens during the acute phase of infection. The lowest  concentration of SARS-CoV-2 viral copies this assay can detect is 250  copies / mL. A negative result does not preclude SARS-CoV-2 infection  and should not be used as the sole basis for treatment or other  patient management decisions.  A negative result may occur with  improper specimen collection / handling,  submission of specimen other  than nasopharyngeal swab, presence of viral mutation(s) within the  areas targeted by this assay, and inadequate number of viral copies  (<250 copies / mL). A negative result must be combined with clinical  observations, patient history, and epidemiological information. If result is POSITIVE SARS-CoV-2 target nucleic acids are DETECTED. The SARS-CoV-2 RNA is generally detectable in upper and lower  respiratory specimens dur ing the acute phase of infection.  Positive  results are indicative of active infection with SARS-CoV-2.  Clinical  correlation with patient history and other diagnostic information is  necessary to determine patient infection status.  Positive results do  not rule out bacterial infection or co-infection with other viruses. If result is PRESUMPTIVE POSTIVE SARS-CoV-2 nucleic acids MAY BE PRESENT.   A presumptive positive result was obtained on the submitted specimen  and confirmed on repeat testing.  While 2019 novel coronavirus  (SARS-CoV-2) nucleic acids may be present in the submitted sample  additional confirmatory testing may be necessary for epidemiological  and / or clinical management purposes  to differentiate between  SARS-CoV-2 and other Sarbecovirus currently known to infect humans.  If clinically indicated additional testing with an alternate test  methodology [phone]) is advised.  The SARS-CoV-2 RNA is generally  detectable in upper and lower respiratory sp ecimens during the acute  phase of infection. The expected result is Negative. Fact Sheet for Patients:  StrictlyIdeas.no Fact Sheet for Healthcare Providers: BankingDealers.co.za This test is not yet approved or cleared by the [place] FDA and has been authorized for detection and/or diagnosis of SARS-CoV-2 by FDA under an Emergency Use Authorization (EUA).  This EUA will remain in effect (meaning this test can be  used) for the duration of the COVID-19 declaration under Section 564(b)(1) of the Act, 21 U.S.C. section 360bbb-3(b)(1), unless the authorization is terminated or revoked sooner. Performed at [facility], [place] [address], [place], [place] [address]     Medical History: No past medical history on file.   Assessment: 77 YOF presented as a code stroke and was seizing en route to the ED as well as in CT.  Patient received fosphenytoin load and Pharmacy consulted to dose Dilantin for maintenance therapy.  Goal of Therapy:  Corrected DPH level 10-20 mcg/mL and cessation of seizure  Plan:  Dilantin 150mg  IV Q8H (~ 5 mg/kg of AdjWB = 84 kg) Check DPH level in AM post loading dose  Sharon Stout D. Mina Marble, PharmD, BCPS, [place] 12/31/2018, 2:01 PM

## 2018-12-31 NOTE — Procedures (Addendum)
ELECTROENCEPHALOGRAM REPORT   Patient: Sharon Stout       Room #: 031C EEG No. ID: [id number] Age: 74 y.o.        Sex: female Referring Physician: Icard Report Date:  12/31/2018        Interpreting Physician: Alexis Goodell  History: Sharon Stout is an 73 y.o. female with altered mental status and seizure  Medications:  Precedex, Propofol, Protonix, Insulin  Conditions of Recording:  This is a 21 channel routine scalp EEG performed with bipolar and monopolar montages arranged in accordance to the international 10/20 system of electrode placement. One channel was dedicated to EKG recording.  The patient is in the intubated and sedated state.  Description:  Artifact is prominent during the recording often obscuring the background rhythm, particularly so prior to the administration of Propofol. When able to be visualized the background is very low voltage.  There appears to be a mixture of beta and alpha activity that is diffusely distributed.  It alternated with synchronous periods of attenuation.  Once given Propofol this discontinuous activity is better visualized and beta activity becomes the dominant rhythm, still alternating with short but frequent periods of attenuation.   No epileptiform activity is noted.  Hyperventilation and intermittent photic stimulation were not performed.  IMPRESSION: This is an abnormal EEG due to a low voltage, burst-suppression pattern seen throughout the tracing.  Burst suppression pattern can be seen in a variety of circumstances, including anesthesia/sedation, drug intoxication, hypothermia, as well as cerebral anoxia.  Clinical/neurological, and radiographic correlation advised.     Alexis Goodell, MD Neurology [phone] 12/31/2018, 12:44 PM

## 2019-01-01 ENCOUNTER — Inpatient Hospital Stay (HOSPITAL_COMMUNITY): Payer: Medicare HMO

## 2019-01-01 DIAGNOSIS — I161 Hypertensive emergency: Secondary | ICD-10-CM

## 2019-01-01 DIAGNOSIS — R4182 Altered mental status, unspecified: Secondary | ICD-10-CM

## 2019-01-01 DIAGNOSIS — R569 Unspecified convulsions: Secondary | ICD-10-CM

## 2019-01-01 LAB — GLUCOSE, CAPILLARY
Glucose-Capillary: 136 mg/dL — ABNORMAL HIGH (ref 70–99)
Glucose-Capillary: 157 mg/dL — ABNORMAL HIGH (ref 70–99)
Glucose-Capillary: 160 mg/dL — ABNORMAL HIGH (ref 70–99)
Glucose-Capillary: 183 mg/dL — ABNORMAL HIGH (ref 70–99)
Glucose-Capillary: 230 mg/dL — ABNORMAL HIGH (ref 70–99)
Glucose-Capillary: 234 mg/dL — ABNORMAL HIGH (ref 70–99)
Glucose-Capillary: 327 mg/dL — ABNORMAL HIGH (ref 70–99)

## 2019-01-01 LAB — CBC
HCT: 35.9 % — ABNORMAL LOW (ref 36.0–46.0)
Hemoglobin: 11.1 g/dL — ABNORMAL LOW (ref 12.0–15.0)
MCH: 25.8 pg — ABNORMAL LOW (ref 26.0–34.0)
MCHC: 30.9 g/dL (ref 30.0–36.0)
MCV: 83.5 fL (ref 80.0–100.0)
Platelets: 211 10*3/uL (ref 150–400)
RBC: 4.3 MIL/uL (ref 3.87–5.11)
RDW: 14.6 % (ref 11.5–15.5)
WBC: 19.9 10*3/uL — ABNORMAL HIGH (ref 4.0–10.5)
nRBC: 0 % (ref 0.0–0.2)

## 2019-01-01 LAB — PHENYTOIN LEVEL, TOTAL: Phenytoin Lvl: 9.4 ug/mL — ABNORMAL LOW (ref 10.0–20.0)

## 2019-01-01 LAB — RENAL FUNCTION PANEL
Albumin: 3 g/dL — ABNORMAL LOW (ref 3.5–5.0)
Anion gap: 19 — ABNORMAL HIGH (ref 5–15)
BUN: 18 mg/dL (ref 8–23)
CO2: 23 mmol/L (ref 22–32)
Calcium: 8.4 mg/dL — ABNORMAL LOW (ref 8.9–10.3)
Chloride: 97 mmol/L — ABNORMAL LOW (ref 98–111)
Creatinine, Ser: 1.91 mg/dL — ABNORMAL HIGH (ref 0.44–1.00)
GFR calc Af Amer: 30 mL/min — ABNORMAL LOW (ref >60)
GFR calc non Af Amer: 26 mL/min — ABNORMAL LOW (ref >60)
Glucose, Bld: 352 mg/dL — ABNORMAL HIGH (ref 70–99)
Phosphorus: 3.5 mg/dL (ref 2.5–4.6)
Potassium: 3.4 mmol/L — ABNORMAL LOW (ref 3.5–5.1)
Sodium: 139 mmol/L (ref 135–145)

## 2019-01-01 LAB — MAGNESIUM: Magnesium: 1.3 mg/dL — ABNORMAL LOW (ref 1.7–2.4)

## 2019-01-01 LAB — HEMOGLOBIN A1C
Hgb A1c MFr Bld: 9.7 % — ABNORMAL HIGH (ref 4.8–5.6)
Mean Plasma Glucose: 231.69 mg/dL

## 2019-01-01 MED ORDER — ACETAMINOPHEN 325 MG PO TABS
650.0000 mg | ORAL_TABLET | Freq: Four times a day (QID) | ORAL | Status: DC | PRN
  Administered 2019-01-01 – 2019-01-02 (×2): 650 mg via ORAL
  Filled 2019-01-01 (×3): qty 2

## 2019-01-01 MED ORDER — INSULIN ASPART 100 UNIT/ML ~~LOC~~ SOLN: 7.0000 [IU] | SUBCUTANEOUS | Status: DC

## 2019-01-01 MED ORDER — INSULIN GLARGINE 100 UNIT/ML ~~LOC~~ SOLN
12.0000 [IU] | Freq: Every day | SUBCUTANEOUS | Status: DC
  Administered 2019-01-01 – 2019-01-05 (×5): 12 [IU] via SUBCUTANEOUS
  Filled 2019-01-01 (×5): qty 0.12

## 2019-01-01 MED ORDER — POTASSIUM CHLORIDE 10 MEQ/100ML IV SOLN
10.0000 meq | INTRAVENOUS | Status: AC
  Administered 2019-01-01 (×6): 10 meq via INTRAVENOUS
  Filled 2019-01-01 (×6): qty 100

## 2019-01-01 MED ORDER — DOCUSATE SODIUM 100 MG PO CAPS: 100.0000 mg | ORAL_CAPSULE | Freq: Every day | ORAL | Status: DC | PRN

## 2019-01-01 MED ORDER — INSULIN ASPART 100 UNIT/ML ~~LOC~~ SOLN
0.0000 [IU] | SUBCUTANEOUS | Status: DC
  Administered 2019-01-01: 7 [IU] via SUBCUTANEOUS
  Administered 2019-01-01: 4 [IU] via SUBCUTANEOUS
  Administered 2019-01-01: 3 [IU] via SUBCUTANEOUS
  Administered 2019-01-02 (×2): 4 [IU] via SUBCUTANEOUS
  Administered 2019-01-02: 3 [IU] via SUBCUTANEOUS
  Administered 2019-01-02: 7 [IU] via SUBCUTANEOUS
  Administered 2019-01-02: 21:00:00 3 [IU] via SUBCUTANEOUS
  Administered 2019-01-02: 11 [IU] via SUBCUTANEOUS
  Administered 2019-01-03: 3 [IU] via SUBCUTANEOUS
  Administered 2019-01-03 (×2): 7 [IU] via SUBCUTANEOUS
  Administered 2019-01-04: 4 [IU] via SUBCUTANEOUS
  Administered 2019-01-04 (×2): 3 [IU] via SUBCUTANEOUS
  Administered 2019-01-04: 4 [IU] via SUBCUTANEOUS
  Administered 2019-01-04 – 2019-01-05 (×2): 3 [IU] via SUBCUTANEOUS

## 2019-01-01 MED ORDER — ACETAMINOPHEN 160 MG/5ML PO SOLN: 650.0000 mg | Freq: Four times a day (QID) | ORAL | Status: DC | PRN

## 2019-01-01 MED ORDER — MAGNESIUM SULFATE 4 GM/100ML IV SOLN
4.0000 g | Freq: Once | INTRAVENOUS | Status: AC
  Administered 2019-01-01: 4 g via INTRAVENOUS
  Filled 2019-01-01: qty 100

## 2019-01-01 MED ORDER — MENTHOL 3 MG MT LOZG: 1.0000 | LOZENGE | OROMUCOSAL | Status: DC | PRN

## 2019-01-01 NOTE — Progress Notes (Addendum)
Reason for consult:  Status epilepticus    Subjective: Patient has had no further clinical seizures since being intubated yesterday.  She is on low-dose of propofol.  MRI brain was performed.  Blood pressures have been within 419 systolic.  Did have a fever of 102 degrees, however since then has been afebrile.  Blood cultures were obtained and are pending.   ROS: Unable to obtain due to patient on sedation  Examination  Vital signs in last 24 hours: Temp:  [96.2 F (35.7 C)-102.2 F (39 C)] 98.1 F (36.7 C) (06/02 0700) Pulse Rate:  [93-136] 119 (06/02 0806) Resp:  [5-28] 24 (06/02 0806) BP: (86-240)/(50-115) 173/83 (06/02 0806) SpO2:  [96 %-100 %] 96 % (06/02 0807) FiO2 (%):  [40 %-100 %] 40 % (06/02 0807) Weight:  [379 kg] 128 kg (06/01 0920)  General: lying in bed, intubated CVS: pulse-normal rate and rhythm RS: breathing comfortably Extremities: normal   Neuro: Patient intubated and sedated She awakens to voice, fixes gaze on examiner Pupils equal reactive, no forced gaze deviation, unable to assess facial symmetry due to ET tube, gag reflex present Moving all 4 extremities spontaneously with equal strength Plantars are downgoing   Basic Metabolic Panel: Recent Labs  Lab 12/31/18 0822 12/31/18 0834 12/31/18 1125 12/31/18 1601 01/01/19 0610  NA 138 138 139 140 139  K 3.7 3.6 4.7 3.9 3.4*  CL 97* 98  --   --  97*  CO2 27  --   --   --  23  GLUCOSE 335* 336*  --   --  352*  BUN 15 18  --   --  18  CREATININE 1.57* 1.40*  --   --  1.91*  CALCIUM 9.2  --   --   --  8.4*  MG  --   --   --   --  1.3*  PHOS  --   --   --   --  3.5    CBC: Recent Labs  Lab 12/31/18 0822 12/31/18 0834 12/31/18 1125 12/31/18 1601 01/01/19 0610  WBC 13.2*  --   --   --  19.9*  NEUTROABS 9.0*  --   --   --   --   HGB 12.3 14.3 13.3 11.9* 11.1*  HCT 40.6 42.0 39.0 35.0* 35.9*  MCV 84.8  --   --   --  83.5  PLT 227  --   --   --  211     Coagulation Studies: Recent Labs     12/31/18 0822  LABPROT 13.8  INR 1.1    Imaging Reviewed:  MRI Aaron Edelman: no acute stroke. Densely calcified lesion on falx cerebri.    ASSESSMENT AND PLAN  73 y.o. female with PMH of HTN, DM, CHF, gout, OSA on CPAP, h/o PRES presents to the ED with status epilepticus.  Patient was intubated for airway protection and loaded with fosphenytoin. Stat EEG performed after intubation and  fosphenytoin negative for subclinical seizures.  Patient more alert this morning, moving all 4 extremities and tracking examiner, not yet following commands. MRI brain negative for PRES/acute stroke. Patient did spike a fever, blood cx ordered and are pending. No neck stiffness.   Status Epilepticus - resolved  Todds parasis: resolved  Hypertensive emergency Acute respiratory failure due to status epilepticus  Recommendations Continue Dilantin for now, will eventually switch to Keppra as this is a better long-term AED due to side effect profile Wean sedation and extubate when possible Seizure precautions  F/U blood cultures, CXR      This patient is neurologically critically ill due to status epilepticus, HTN emergency/  She is at risk for significant risk of neurological worsening from worsening seizures, respiratory failure, infection, cardiac failure and sudden death in epilepsy. This patient's care requires constant monitoring of vital signs, hemodynamics, respiratory and cardiac monitoring, review of multiple databases, neurological assessment, discussion with family, other specialists and medical decision making of high complexity.  I spent 35 minutes of neurocritical time in the care of this patient.     Karena Addison Aroor [facility] Pager Number [phone] For questions after 7pm please refer to AMION to reach the Neurologist on call

## 2019-01-01 NOTE — Progress Notes (Addendum)
NAME:  Sharon Stout, MRN:  [id number], DOB:  August 30, 1945, LOS: 1 ADMISSION DATE:  12/31/2018, CONSULTATION DATE:  12/31/2018 REFERRING MD:  Dr. Jeanell Sparrow, CHIEF COMPLAINT:  Stroke/ SE  Brief History    73 year old female presenting from home LSW around 2100 last night, 5/31. Found this morning with new confusion and left sided gaze and weakness.  S/P three witnessed seizures concerning for status epilepticus and noted to be hypertensive.  Required emergent intubation and placement of femoral central line.  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  Neurology consulting, PCCM to admit.    Past Medical History  HTN, DM, diastolic HF, gout, asthma, CKD stage 3, HLD, obesity, osteoarthritis, OSA on CPAP  Significant Hospital Events   6/1 Admitted 6/2 no seizures overnight. BP better controlled. Working on Careers adviser. Glycemic control an issue. SSI changed and basal dosing added. Spiking fever. Got Unasyn   Consults:  Neurology   Procedures:  6/1 ETT >> 6/1 R femoral CVL >>  Significant Diagnostic Tests:  6/1 CTH >>1. No acute cortically based infarct or acute intracranial hemorrhage identified. ASPECTS is 10. 2. 17 mm left para falcine dural calcification or calcified meningioma with no edema or significant mass effect. EEG negative for seizure  6/1 MRI brain 1. No acute intracranial abnormality or specific etiology for seizure.2. Densely calcified lesion along the falx cerebri, likely calcified meningioma. No abnormality of the underlying parenchyma. Micro Data:  6/1 SARS coronavirus >> neg BCX2 6/1>>> Sputum 6/1>> Antimicrobials:  unasyn 6/1>>>  Interim history/subjective:  No distress.  Somewhat tremulous but following commands  Objective   Blood pressure (Abnormal) 173/83, pulse (Abnormal) 119, temperature 98.6 F (37 C), resp. rate (Abnormal) 24, height 5\' 4"  (1.626 m), weight 128 kg, SpO2 96 %.    Vent Mode: PSV;CPAP FiO2 (%):  [40 %-100 %] 40 % Set Rate:  [16 bmp-24 bmp] 24 bmp Vt Set:  [440 mL] 440  mL PEEP:  [5 cmH20] 5 cmH20 Pressure Support:  [10 cmH20] 10 cmH20 Plateau Pressure:  [15 cmH20-31 cmH20] 17 cmH20   Intake/Output Summary (Last 24 hours) at 01/01/2019 0920 Last data filed at 01/01/2019 0800 Gross per 24 hour  Intake 1072.61 ml  Output 625 ml  Net 447.61 ml   Filed Weights   12/31/18 0920  Weight: 128 kg    Examination: General: 73 year old female patient currently weaning on mechanical ventilation she is in no acute distress HEENT normocephalic atraumatic no jugular venous distention she is orally intubated mucous membranes are moist Pulmonary: Coarse scattered rhonchi.  Tidal volumes acceptable on spontaneous breathing trial ranging in the 350 to 400 cc volume on pressure support of 5 Cardiac: Tachycardic regular rate and rhythm Abdomen: Soft nontender no organomegaly positive bowel sounds Extremities: Warm and dry brisk capillary refill strong pulses Neuro: Tremulous, follows commands, moving all extremities,  Resolved Hospital Problem list   Status epilepticus Todd's paralysis Assessment & Plan:   Acute encephalopathy with 3 witnessed seizures in the setting of hypertensive emergency Follow-up EEG negative for seizure Now following commands Plan Continuing Dilantin for now but eventually switched to Keppra as has lower side effect profile Continue seizure precautions Control blood pressure Further recommendations as per neurology service Serial neuro checks  Hypertensive Emergency Hx diastolic HF Plan We will aim for normotensive blood pressure now, adding back Lasix, Lopressor and clonidine as able  PRN labetalol  Continue telemetry monitoring    Acute Hypoxic Respiratory insufficiency Hx Asthma/ OSA on CPAP Chest x-ray personally reviewed: Low  lung volumes basilar atelectasis otherwise no abnormalities -Acceptable volumes on pressure support ventilation Plan Discontinue sedation Spontaneous breathing trial with hope to extubate later this  morning CPAP at at bedtime Pulse oximetry Oxygen as indicated for saturation greater than 92% Continuing Brovana and budesonide   Leukocytosis w/ fever  -has basilar atx. Could also consider aspiration.  Plan unasyn started 6/1; f/u cultures  Am cbc  Acute on chronic renal failure; CKD stage 3 at baseline - sCr 1.56 in 11/2017 Plan KVO IV fluids Continue blood pressure control Renal dose medications Strict intake and output A.m. chemistry  Fluid electrolyte imbalance: Hypokalemia, and hypomagnesemia Plan Replace and recheck   DMT2; with severe hyperglycemia Plan Change sliding scale to resistant Add low-dose basal aspart, will hold off on longer acting basal dosing for now as pending extubation   Best practice:  Diet: NPO Pain/Anxiety/Delirium protocol (if indicated): Precedex/ prn fentanyl; discontinuing 6/2 with plans for extubation VAP protocol (if indicated): yes DVT prophylaxis: heparin SQ/ SCD GI prophylaxis: PPI Glucose control: CBG q 4 Mobility: BR Code Status: Full  Family Communication: Daughter [name], [phone] called/ updated by phone Disposition: ICU remains critically ill, however successfully weaning currently.  She has had no further seizure activity.  Hopefully we can get her extubated later this morning.  She will need ongoing blood pressure control aiming for normal blood pressure at this point.  Continue anticonvulsants.  Will need CPAP postextubation at at bedtime.  Erick Colace ACNP-BC [place] Pager # [phone] OR # [phone] if no answer

## 2019-01-01 NOTE — Procedures (Signed)
Extubation Procedure Note  Patient Details:   Name: Sharon Stout DOB: 11-17-1945 MRN: [id number]   Airway Documentation:    Vent end date: 01/01/19 Vent end time: 0945   Evaluation  O2 sats: stable throughout Complications: No apparent complications Patient did tolerate procedure well. Bilateral Breath Sounds: Diminished, Stridor   Yes   Positive cuff leak noted. Patient placed on [place] 4L with humidity. Patient has some upper airway stridor, albuterol neb treatment given. NP notified. Patient able to reach 250 mL using incentive.   [place] 01/01/2019, 10:08 AM

## 2019-01-01 NOTE — Progress Notes (Signed)
Inpatient Diabetes Program Recommendations  AACE/ADA: New Consensus Statement on Inpatient Glycemic Control (2015)  Target Ranges:  Prepandial:   less than 140 mg/dL      Peak postprandial:   less than 180 mg/dL (1-2 hours)      Critically ill patients:  140 - 180 mg/dL   Lab Results  Component Value Date   GLUCAP 327 (H) 01/01/2019    Review of Glycemic Control Results for ZARRIA, TOWELL (MRN [id number]) as of 01/01/2019 11:31  Ref. Range 01/01/2019 00:42 01/01/2019 03:13 01/01/2019 07:24  Glucose-Capillary Latest Ref Range: 70 - 99 mg/dL 183 (H) 234 (H) 327 (H)   Diabetes history: Type 2 DM Outpatient Diabetes medications: Trulicity 6.29 mg Q week Current orders for Inpatient glycemic control: Novolog 0-20 units Q4H, Novolog 7 units Q4H  Inpatient Diabetes Program Recommendations:    Last A1C was 6.5% from 2018, consider repeating A1C.   Noted updated insulin orders and PA note. Although, trends are increasing, concern that Novolog 7 units Q4H may cause lows over next 24 hours. Would recommend Lantus 12 units QD (to start now) and discontinue Novolog 7 units Q4H.   Thanks, Bronson Curb, MSN, RNC-OB Diabetes Coordinator [phone] (8a-5p)

## 2019-01-02 ENCOUNTER — Encounter (HOSPITAL_COMMUNITY): Payer: Self-pay | Admitting: *Deleted

## 2019-01-02 DIAGNOSIS — R569 Unspecified convulsions: Secondary | ICD-10-CM

## 2019-01-02 DIAGNOSIS — I161 Hypertensive emergency: Secondary | ICD-10-CM

## 2019-01-02 LAB — GLUCOSE, CAPILLARY
Glucose-Capillary: 168 mg/dL — ABNORMAL HIGH (ref 70–99)
Glucose-Capillary: 218 mg/dL — ABNORMAL HIGH (ref 70–99)
Glucose-Capillary: 255 mg/dL — ABNORMAL HIGH (ref 70–99)
Glucose-Capillary: 137 mg/dL — ABNORMAL HIGH (ref 70–99)
Glucose-Capillary: 131 mg/dL — ABNORMAL HIGH (ref 70–99)
Glucose-Capillary: 107 mg/dL — ABNORMAL HIGH (ref 70–99)

## 2019-01-02 LAB — COMPREHENSIVE METABOLIC PANEL
ALT: 16 U/L (ref 0–44)
AST: 33 U/L (ref 15–41)
Albumin: 3.1 g/dL — ABNORMAL LOW (ref 3.5–5.0)
Alkaline Phosphatase: 79 U/L (ref 38–126)
Anion gap: 16 — ABNORMAL HIGH (ref 5–15)
BUN: 20 mg/dL (ref 8–23)
CO2: 23 mmol/L (ref 22–32)
Calcium: 8.1 mg/dL — ABNORMAL LOW (ref 8.9–10.3)
Chloride: 100 mmol/L (ref 98–111)
Creatinine, Ser: 1.85 mg/dL — ABNORMAL HIGH (ref 0.44–1.00)
GFR calc Af Amer: 31 mL/min — ABNORMAL LOW (ref >60)
GFR calc non Af Amer: 27 mL/min — ABNORMAL LOW (ref >60)
Glucose, Bld: 201 mg/dL — ABNORMAL HIGH (ref 70–99)
Potassium: 4.7 mmol/L (ref 3.5–5.1)
Sodium: 139 mmol/L (ref 135–145)
Total Bilirubin: 0.9 mg/dL (ref 0.3–1.2)
Total Protein: 7.2 g/dL (ref 6.5–8.1)

## 2019-01-02 LAB — CBC
HCT: 33.9 % — ABNORMAL LOW (ref 36.0–46.0)
Hemoglobin: 10.3 g/dL — ABNORMAL LOW (ref 12.0–15.0)
MCH: 25.4 pg — ABNORMAL LOW (ref 26.0–34.0)
MCHC: 30.4 g/dL (ref 30.0–36.0)
MCV: 83.5 fL (ref 80.0–100.0)
Platelets: 183 10*3/uL (ref 150–400)
RBC: 4.06 MIL/uL (ref 3.87–5.11)
RDW: 14.6 % (ref 11.5–15.5)
WBC: 20 10*3/uL — ABNORMAL HIGH (ref 4.0–10.5)
nRBC: 0 % (ref 0.0–0.2)

## 2019-01-02 MED ORDER — LEVETIRACETAM 500 MG PO TABS
500.0000 mg | ORAL_TABLET | Freq: Two times a day (BID) | ORAL | Status: DC
  Administered 2019-01-02 – 2019-01-05 (×6): 500 mg via ORAL
  Filled 2019-01-02 (×6): qty 1

## 2019-01-02 NOTE — Progress Notes (Signed)
Pt states she does wear CPAP at home but does not want to wear CPAP tonight.

## 2019-01-02 NOTE — Evaluation (Addendum)
Occupational Therapy Evaluation Patient Details Name: Sharon Stout MRN: [id number] DOB: April 13, 1946 Today's Date: 01/02/2019    History of Present Illness Pt is a 73 y.o. F with significant PMH of PRES in 11/2017, HTN, DM, diastolic HF, gout, CKD stage 3, obesity, who presents with new confusion and left sided gaze and weakness. Had 3 witnessed seizures concerning for status epilepticus and noted to be hypertensive.    Clinical Impression   This 73 y/o female presents with the above. PTA pt reports she is mod independent with ADL and functional mobility, using SPC. Pt presenting with generalized weakness, decreased standing balance and functional performance. Pt requiring minA(+2) for functional transfers using RW. She currently requires minguard assist for seated UB ADL, mod-maxA for LB and toileting ADL. Pt lives with daughter and daughter's spouse who report work from home and able to assist pt with ADL/iADL PRN, pt and pt's family with preference for pt to return home at time of discharge. Pt will benefit from continued acute OT services and recommend follow up [facility] therapy services after discharge to maximize her safety and independence with ADL and mobility. Will follow.     Follow Up Recommendations  Home health OT;Supervision/Assistance - 24 hour    Equipment Recommendations  3 in 1 bedside commode           Precautions / Restrictions Precautions Precautions: Fall Restrictions Weight Bearing Restrictions: No      Mobility Bed Mobility Overal bed mobility: Needs Assistance Bed Mobility: Supine to Sit     Supine to sit: Min assist     General bed mobility comments: MinA to pull up to sitting edge of bed  Transfers Overall transfer level: Needs assistance Equipment used: Rolling walker (2 wheeled) Transfers: Sit to/from [facility] Sit to Stand: Min assist;+2 safety/equipment Stand pivot transfers: Min assist;+2 safety/equipment       General transfer  comment: MinA (+2 for safety) for stabilizing once standing and for stand pivot from recliner > BSC. Cues provided for hand placement, sequencing, directional.    Balance Overall balance assessment: Needs assistance Sitting-balance support: Feet supported Sitting balance-Leahy Scale: Fair     Standing balance support: Bilateral upper extremity supported Standing balance-Leahy Scale: Poor Standing balance comment: reliant on external support                           ADL either performed or assessed with clinical judgement   ADL Overall ADL's : Needs assistance/impaired Eating/Feeding: Set up;Sitting   Grooming: Set up;Min guard;Sitting   Upper Body Bathing: Minimal assistance;Sitting   Lower Body Bathing: Moderate assistance;Sit to/from stand;+2 for safety/equipment   Upper Body Dressing : Minimal assistance;Sitting   Lower Body Dressing: Maximal assistance;+2 for safety/equipment;Sit to/from stand Lower Body Dressing Details (indicate cue type and reason): pt unable to don socks seated EOB, requires assist Toilet Transfer: Minimal assistance;+2 for physical assistance;+2 for safety/equipment;Stand-pivot;BSC;RW   Toileting- Clothing Manipulation and Hygiene: Maximal assistance;+2 for physical assistance;+2 for safety/equipment;Sit to/from stand Toileting - Clothing Manipulation Details (indicate cue type and reason): assist for gown management and peri-care after BM     Functional mobility during ADLs: Minimal assistance;+2 for physical assistance;+2 for safety/equipment;Rolling walker General ADL Comments: pt easily fatigued, with generalized weakness and requires min safety cues during mobility and ADL tasks      Vision         Perception     Praxis      Pertinent Vitals/Pain Pain  Assessment: Faces Faces Pain Scale: Hurts little more Pain Location: vaginal region, reports likely due to catheter Pain Descriptors / Indicators: Discomfort Pain  Intervention(s): Monitored during session;Repositioned     Hand Dominance Right   Extremity/Trunk Assessment Upper Extremity Assessment Upper Extremity Assessment: Generalized weakness   Lower Extremity Assessment Lower Extremity Assessment: Defer to PT evaluation   Cervical / Trunk Assessment Cervical / Trunk Assessment: Other exceptions Cervical / Trunk Exceptions: increased body habitus   Communication Communication Communication: No difficulties   Cognition Arousal/Alertness: Awake/alert Behavior During Therapy: WFL for tasks assessed/performed Overall Cognitive Status: Impaired/Different from baseline Area of Impairment: Orientation;Awareness;Problem solving;Safety/judgement                 Orientation Level: Disoriented to;Situation       Safety/Judgement: Decreased awareness of safety;Decreased awareness of deficits Awareness: Intellectual Problem Solving: Difficulty sequencing;Requires verbal cues;Requires tactile cues General Comments: A&Ox3 (not oriented to situation). Very pleasant, following commands consistently. Needs cues for safety   General Comments  VSS, pt on RA during session    Exercises Exercises: General Lower Extremity General Exercises - Lower Extremity Ankle Circles/Pumps: 10 reps;Both;Seated   Shoulder Instructions      Home Living Family/patient expects to be discharged to:: Private residence Living Arrangements: Children(daughter, Sharon Stout) Available Help at Discharge: Family Type of Home: House Home Access: Ramped entrance     Home Layout: Two level;Other (Comment)(chair lift)     Bathroom Shower/Tub: Teacher, early years/pre: Handicapped height     Home Equipment: [place] - single point;Walker - 4 wheels          Prior Functioning/Environment Level of Independence: Needs assistance  Gait / Transfers Assistance Needed: uses cane ADL's / Homemaking Assistance Needed: pt daughter states pt is independent with  ADL's            OT Problem List: Decreased strength;Decreased range of motion;Decreased activity tolerance;Impaired balance (sitting and/or standing);Decreased cognition;Decreased safety awareness;Decreased knowledge of precautions;Obesity      OT Treatment/Interventions: Self-care/ADL training;Therapeutic exercise;DME and/or AE instruction;Therapeutic activities;Patient/family education;Balance training;Cognitive remediation/compensation;Energy conservation    OT Goals(Current goals can be found in the care plan section) Acute Rehab OT Goals Patient Stated Goal: "get stronger." OT Goal Formulation: With patient Time For Goal Achievement: 01/16/19 Potential to Achieve Goals: Good  OT Frequency: Min 2X/week   Barriers to D/C:            Co-evaluation PT/OT/SLP Co-Evaluation/Treatment: Yes Reason for Co-Treatment: For patient/therapist safety;To address functional/ADL transfers   OT goals addressed during session: ADL's and self-care      AM-PAC OT "6 Clicks" Daily Activity     Outcome Measure Help from another person eating meals?: None Help from another person taking care of personal grooming?: A Little Help from another person toileting, which includes using toliet, bedpan, or urinal?: A Lot Help from another person bathing (including washing, rinsing, drying)?: A Lot Help from another person to put on and taking off regular upper body clothing?: A Little Help from another person to put on and taking off regular lower body clothing?: A Lot 6 Click Score: 16   End of Session Equipment Utilized During Treatment: Gait belt;Rolling walker Nurse Communication: Mobility status  Activity Tolerance: Patient tolerated treatment well Patient left: in chair;with call bell/phone within reach;with chair alarm set  OT Visit Diagnosis: Muscle weakness (generalized) (M62.81);Unsteadiness on feet (R26.81);Other symptoms and signs involving the nervous system (R29.898)  Time: 9861-4830 OT Time Calculation (min): 34 min Charges:  OT General Charges $OT Visit: 1 Visit OT Evaluation $OT Eval Moderate Complexity: 1 Mod  Lou Cal, OT [employer] Pager [phone] Office [phone]   Raymondo Band 01/02/2019, 12:18 PM

## 2019-01-02 NOTE — Progress Notes (Signed)
Reason for consult: Status epilepticus  Subjective: Patient is awake and alert.  She is aware that she had seizures.  She is extubated and apart from a slightly hoarse voice has no other focal neurological deficits.   ROS: Negative except for above  Examination  Vital signs in last 24 hours: Temp:  [97.9 F (36.6 C)-99 F (37.2 C)] 98.7 F (37.1 C) (06/03 1600) Pulse Rate:  [77-118] 102 (06/03 1900) Resp:  [14-30] 21 (06/03 1700) BP: (117-188)/(46-89) 135/61 (06/03 1900) SpO2:  [88 %-100 %] 98 % (06/03 1940) Weight:  [124.7 kg] 124.7 kg (06/03 0403)  General: lying in bed CVS: pulse-normal rate and rhythm RS: breathing comfortably Extremities: normal   Neuro: MS: Alert, oriented, follows commands CN: pupils equal and reactive,  EOMI, face symmetric, tongue midline, normal sensation over face, Motor: 5/5 strength in all 4 extremities Coordination: normal Gait: not tested  Basic Metabolic Panel: Recent Labs  Lab 12/31/18 0822 12/31/18 0834 12/31/18 1125 12/31/18 1601 01/01/19 0610 01/02/19 0141  NA 138 138 139 140 139 139  K 3.7 3.6 4.7 3.9 3.4* 4.7  CL 97* 98  --   --  97* 100  CO2 27  --   --   --  23 23  GLUCOSE 335* 336*  --   --  352* 201*  BUN 15 18  --   --  18 20  CREATININE 1.57* 1.40*  --   --  1.91* 1.85*  CALCIUM 9.2  --   --   --  8.4* 8.1*  MG  --   --   --   --  1.3*  --   PHOS  --   --   --   --  3.5  --     CBC: Recent Labs  Lab 12/31/18 0822 12/31/18 0834 12/31/18 1125 12/31/18 1601 01/01/19 0610 01/02/19 0141  WBC 13.2*  --   --   --  19.9* 20.0*  NEUTROABS 9.0*  --   --   --   --   --   HGB 12.3 14.3 13.3 11.9* 11.1* 10.3*  HCT 40.6 42.0 39.0 35.0* 35.9* 33.9*  MCV 84.8  --   --   --  83.5 83.5  PLT 227  --   --   --  211 183     Coagulation Studies: Recent Labs    12/31/18 0822  LABPROT 13.8  INR 1.1    Imaging Reviewed:     ASSESSMENT AND PLAN  73 y.o.femalewith PMHof HTN, DM, CHF, gout, OSA on CPAP,h/o PRES  presents tothe ED with status epilepticus.  Patient was intubated for airway protection and loaded with fosphenytoin. Stat EEG performed after intubation and  fosphenytoin negative for subclinical seizures.  Patient more alert this morning, moving all 4 extremities and tracking examiner, not yet following commands. MRI brain negative for PRES/acute stroke. Patient did spike a fever, blood cx ordered and are pending. No neck stiffness.   Patient is extubated and doing well.  Status Epilepticus - resolved  Todds parasis: resolved  Hypertensive emergency Acute respiratory failure due to status epilepticus  Recommendations Which Dilantin to Keppra 500 mg twice daily ( renally dosed) Seizure precautions F/U neurology as outpatient   Per [facility] statutes, patients with seizures are not allowed to drive until they have been seizure-free for six months. Use caution when using heavy equipment or power tools. Avoid working on ladders or at heights. Take showers instead of baths. Ensure the water  temperature is not too high on the home water heater. Do not go swimming alone. Do not lock yourself in a room alone (i.e. bathroom). When caring for infants or small children, sit down when holding, feeding, or changing them to minimize risk of injury to the child in the event you have a seizure. Maintain good sleep hygiene. Avoid alcohol.    If Paradise Vensel has another seizure, call 911 and bring them back to the ED if:       A.  The seizure lasts longer than 5 minutes.            B.  The patient doesn't wake shortly after the seizure or has new problems such as difficulty seeing, speaking or moving following the seizure       C.  The patient was injured during the seizure       D.  The patient has a temperature over 102 F (39C)       E.  The patient vomited during the seizure and now is having trouble breathing   We will be available as needed  Karena Addison [facility] Pager Number [phone] For questions after 7pm please refer to AMION to reach the Neurologist on call

## 2019-01-02 NOTE — Evaluation (Signed)
Physical Therapy Evaluation Patient Details Name: Sharon Stout MRN: [id number] DOB: [birth year] Today's Date: 01/02/2019   History of Present Illness  Pt is a 73 y.o. F with significant PMH of PRES in 11/2017, HTN, DM, diastolic HF, gout, CKD stage 3, obesity, who presents with new confusion and left sided gaze and weakness. Had 3 witnessed seizures concerning for status epilepticus and noted to be hypertensive.   Clinical Impression  Pt admitted with above diagnosis. Pt currently with functional limitations due to the deficits listed below (see PT Problem List). Prior to admission, pt is independent with ADL's and uses a cane for limited household ambulation. On PT evaluation, pt requiring two person min assist for functional mobility. Ambulating 3 feet from bed to chair with walker. Presents with generalized weakness, decreased cognition/safety awareness, and balance impairments. Pt will benefit from skilled PT to increase their independence and safety with mobility to allow discharge to the venue listed below.    Plan of care discussed with pt daughter. She states she would like pt to come home with HHPT, and she is able to provide 24/7 assistance.      Follow Up Recommendations Home health PT;Supervision/Assistance - 24 hour    Equipment Recommendations  None recommended by PT    Recommendations for Other Services       Precautions / Restrictions Precautions Precautions: Fall Restrictions Weight Bearing Restrictions: No      Mobility  Bed Mobility Overal bed mobility: Needs Assistance Bed Mobility: Supine to Sit     Supine to sit: Min assist     General bed mobility comments: MinA to pull up to sitting edge of bed  Transfers Overall transfer level: Needs assistance Equipment used: Rolling walker (2 wheeled) Transfers: Sit to/from [facility] Sit to Stand: Min assist;+2 safety/equipment Stand pivot transfers: Min assist;+2 safety/equipment        General transfer comment: MinA (+2 for safety) for stabilizing once standing and for stand pivot from recliner > BSC. Cues provided for hand placement, sequencing, directional.  Ambulation/Gait Ambulation/Gait assistance: Min assist;+2 safety/equipment Gait Distance (Feet): 3 Feet Assistive device: Rolling walker (2 wheeled) Gait Pattern/deviations: Step-through pattern;Decreased stride length;Trunk flexed Gait velocity: decreased   General Gait Details: MinA for stability. Cues provided for controlling anterior momentum and keeping feet inside of walker in addition to walker proximity  Stairs            Wheelchair Mobility    Modified Rankin (Stroke Patients Only) Modified Rankin (Stroke Patients Only) Pre-Morbid Rankin Score: No significant disability Modified Rankin: Moderately severe disability     Balance Overall balance assessment: Needs assistance Sitting-balance support: Feet supported Sitting balance-Leahy Scale: Fair     Standing balance support: Bilateral upper extremity supported Standing balance-Leahy Scale: Poor Standing balance comment: reliant on external support                             Pertinent Vitals/Pain Pain Assessment: No/denies pain    Home Living Family/patient expects to be discharged to:: Private residence Living Arrangements: Children Available Help at Discharge: Family Type of Home: House Home Access: Ramped entrance     Home Layout: Two level;Other (Comment)(chair lift) Home Equipment: Cane - single point;Walker - 4 wheels      Prior Function Level of Independence: Needs assistance   Gait / Transfers Assistance Needed: uses cane  ADL's / Homemaking Assistance Needed: pt daughter states pt is independent with ADL's  Hand Dominance   Dominant Hand: Right    Extremity/Trunk Assessment   Upper Extremity Assessment Upper Extremity Assessment: Defer to OT evaluation    Lower Extremity  Assessment Lower Extremity Assessment: Generalized weakness    Cervical / Trunk Assessment Cervical / Trunk Assessment: Other exceptions Cervical / Trunk Exceptions: increased body habitus  Communication   Communication: No difficulties  Cognition Arousal/Alertness: Awake/alert Behavior During Therapy: WFL for tasks assessed/performed Overall Cognitive Status: Impaired/Different from baseline Area of Impairment: Orientation;Awareness;Problem solving;Safety/judgement                 Orientation Level: Disoriented to;Situation       Safety/Judgement: Decreased awareness of safety;Decreased awareness of deficits Awareness: Intellectual Problem Solving: Difficulty sequencing;Requires verbal cues;Requires tactile cues General Comments: A&Ox3 (not oriented to situation). Very pleasant, following commands consistently. Needs cues for safety      General Comments      Exercises General Exercises - Lower Extremity Ankle Circles/Pumps: 10 reps;Both;Seated   Assessment/Plan    PT Assessment Patient needs continued PT services  PT Problem List Decreased strength;Decreased activity tolerance;Decreased balance;Decreased mobility;Decreased cognition;Decreased safety awareness       PT Treatment Interventions DME instruction;Gait training;Functional mobility training;Therapeutic activities;Therapeutic exercise;Balance training;Patient/family education    PT Goals (Current goals can be found in the Care Plan section)  Acute Rehab PT Goals Patient Stated Goal: "get stronger." PT Goal Formulation: With patient Time For Goal Achievement: 01/16/19 Potential to Achieve Goals: Good    Frequency Min 3X/week   Barriers to discharge        Co-evaluation PT/OT/SLP Co-Evaluation/Treatment: Yes Reason for Co-Treatment: For patient/therapist safety;To address functional/ADL transfers PT goals addressed during session: Mobility/safety with mobility         AM-PAC PT "6 Clicks"  Mobility  Outcome Measure Help needed turning from your back to your side while in a flat bed without using bedrails?: None Help needed moving from lying on your back to sitting on the side of a flat bed without using bedrails?: A Little Help needed moving to and from a bed to a chair (including a wheelchair)?: A Little Help needed standing up from a chair using your arms (e.g., wheelchair or bedside chair)?: A Little Help needed to walk in hospital room?: A Little Help needed climbing 3-5 steps with a railing? : A Lot 6 Click Score: 18    End of Session Equipment Utilized During Treatment: Gait belt Activity Tolerance: Patient tolerated treatment well Patient left: in chair;with call bell/phone within reach;with chair alarm set Nurse Communication: Mobility status PT Visit Diagnosis: Unsteadiness on feet (R26.81);Muscle weakness (generalized) (M62.81);Difficulty in walking, not elsewhere classified (R26.2)    Time: 2956-2130 PT Time Calculation (min) (ACUTE ONLY): 43 min   Charges:   PT Evaluation $PT Eval Moderate Complexity: 1 Mod PT Treatments $Gait Training: 8-22 mins        Ellamae Sia, PT, DPT Acute Rehabilitation Services Pager [phone] Office [phone]   Willy Eddy 01/02/2019, 10:41 AM

## 2019-01-02 NOTE — Progress Notes (Signed)
PROGRESS NOTE    Taci Sterling  TIR:[id number] DOB: [birth year] DOA: 12/31/2018 PCP: Luberta Mutter, MD (Confirm with patient/family/NH records and if not entered, this HAS to be entered at [facility] point of entry. "No PCP" if truly none.)   Brief Narrative: 73 year old female with prior history of asthma, diastolic HF, HTN, HLD, DMT2, gout, OSA on CPAP, and osteoarthritis presenting from home LSW around 2100 last night (5/31). Patient lives with her daughter, [name].  She states patient has complained of in  intermittent headache for weeks.  She was changed from clonidine pill to clonidine patch last week.  Daughter states she is compliant with her other medications.  Denies ETOH or drug abuse.  Patient does chew tobacco.  Daughter also reports patient recently had a tooth pulled and completed PCN abx. She woke-up this morning complaining of worsening headache.  Daughter states she vomited 2-3 times, bilious/ non-bloody.  She also noticed she had a left gaze but really didn't notice any specific weakness given that she was able to stand.  Her daughter thought she seems confused and checked her blood pressure and found that it was high and called EMS given her symptoms were very similar to those in May 2019. Was admitted in May 2019 at [facility] with PRES. Recently started on clonidine patch for her blood pressure. She reportedly was found with left gaze and left sided weakness with EMS with witnessed tonic clonic seizure that abated on its own lasting for 1-2 minutes. Glucose 300.  Initial blood pressure in ER was 240/93.  Was taken for emergent head CT where she had a second tonic clonic seizure. Given poor/ no IV access was given IM Ativan and emergent central placed. Neurology seeing, fosphenytoin ordered given initial concern for stroke versus status epilepticus.  She remained altered and hypoxic unable to protect airway and therefore intubated. Initially sedated on propofol but was switched to  precedex.  Labs noted for sCr 1.57, WBC 13.2, negative ETOH,  PCCM called for admission.   Patient's mental status drastically with blood pressure 12 hours, back to baseline mental status AO x4 without any ongoing deficits.  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 Neurology following, appreciate insight and recommendations Please switch to Keppra from IV fosphenytoin, defer to neurology for timing Continue PT OT and speech evaluation Todd's paralysis appears to have resolved over the past 24 hours Continue seizure prophylaxis/precautions  Hypertensive emergency in the setting of above Questionably etiology for above onset seizure Continue antihypertensives; titrate necessary Patient declines any discontinuation, poor compliance or recent changes with PCP of her regimen  Acute hypoxic respiratory failure in the setting of above,poa, resolving Concurrent aspiration PNA/Pneumonitis, improving In the setting of above, now resolving given mental status back to baseline Need to wean oxygen as tolerated Continue unasyn - plan for 5 day course  CKD 3 At baseline, continue to follow with morning labs  Heart failure, diastolic function - unknown EF not in acute exacerbation Unclear EF, no echo here in house, no current indication for echo given patient appears euvolemic, continue home medications  Non-insulin-dependent diabetes type 2 Continue sliding scale insulin and hypoglycemic protocol  DVT prophylaxis: Heparin Code Status: Full  Disposition Plan: Pending resolution of hypoxia, weakness, tolerance of p.o. antiepileptic medications without recurrence of seizure.  Tentative disposition in the next 24 to 48 hours pending clinical course and resolution of symptoms as  above.   Consultants:  Neurology  Subjective: No acute issues or events overnight, patient declines chest pain, nausea, vomiting, diarrhea, constipation, headache, fevers, chills.  Objective: Vitals:   01/02/19 0800 01/02/19 0900 01/02/19 1000 01/02/19 1200  BP: (!) 157/71 (!) 145/62 133/89   Pulse: (!) 110 (!) 106 (!) 108   Resp: (!) 26 (!) 24 17   Temp: 98.6 F (37 C) 98.4 F (36.9 C) 98.6 F (37 C) 99 F (37.2 C)  TempSrc:    Oral  SpO2: 100% 96% 97%   Weight:      Height:        Intake/Output Summary (Last 24 hours) at 01/02/2019 1458 Last data filed at 01/02/2019 0900 Gross per 24 hour  Intake 1224.44 ml  Output 650 ml  Net 574.44 ml   Filed Weights   12/31/18 0920 01/02/19 0403  Weight: 128 kg 124.7 kg    Examination:  General:  Pleasantly resting in bed, No acute distress. HEENT:  Normocephalic atraumatic.  Sclerae nonicteric, noninjected.  Extraocular movements intact bilaterally. Neck:  Without mass or deformity.  Trachea is midline. Lungs:  Clear to auscultate bilaterally without rhonchi, wheeze, or rales. Heart:  Regular rate and rhythm.  Without murmurs, rubs, or gallops. Abdomen:  Soft, nontender, nondistended.  Without guarding or rebound. Extremities: Without cyanosis, clubbing, edema, or obvious deformity. Vascular:  Dorsalis pedis and posterior tibial pulses palpable bilaterally. Skin:  Warm and dry, no erythema, no ulcerations.  Data Reviewed: I have personally reviewed following labs and imaging studies  CBC: Recent Labs  Lab 12/31/18 0822 12/31/18 0834 12/31/18 1125 12/31/18 1601 01/01/19 0610 01/02/19 0141  WBC 13.2*  --   --   --  19.9* 20.0*  NEUTROABS 9.0*  --   --   --   --   --   HGB 12.3 14.3 13.3 11.9* 11.1* 10.3*  HCT 40.6 42.0 39.0 35.0* 35.9* 33.9*  MCV 84.8  --   --   --  83.5 83.5  PLT 227  --   --   --  211 397   Basic Metabolic Panel: Recent Labs  Lab 12/31/18 0822 12/31/18 0834 12/31/18 1125 12/31/18 1601  01/01/19 0610 01/02/19 0141  NA 138 138 139 140 139 139  K 3.7 3.6 4.7 3.9 3.4* 4.7  CL 97* 98  --   --  97* 100  CO2 27  --   --   --  23 23  GLUCOSE 335* 336*  --   --  352* 201*  BUN 15 18  --   --  18 20  CREATININE 1.57* 1.40*  --   --  1.91* 1.85*  CALCIUM 9.2  --   --   --  8.4* 8.1*  MG  --   --   --   --  1.3*  --   PHOS  --   --   --   --  3.5  --    GFR: Estimated Creatinine Clearance: 35.4 mL/min (A) (by C-G formula based on SCr of 1.85 mg/dL (H)). Liver Function Tests: Recent Labs  Lab 12/31/18 0822 01/01/19 0610 01/02/19 0141  AST 22  --  33  ALT 16  --  16  ALKPHOS 98  --  79  BILITOT 0.6  --  0.9  PROT 8.1  --  7.2  ALBUMIN 3.7 3.0* 3.1*   No  results for input(s): LIPASE, AMYLASE in the last 168 hours. No results for input(s): AMMONIA in the last 168 hours. Coagulation Profile: Recent Labs  Lab 12/31/18 0822  INR 1.1   Cardiac Enzymes: No results for input(s): CKTOTAL, CKMB, CKMBINDEX, TROPONINI in the last 168 hours. BNP (last 3 results) No results for input(s): PROBNP in the last 8760 hours. HbA1C: Recent Labs    01/01/19 0610  HGBA1C 9.7*   CBG: Recent Labs  Lab 01/01/19 1931 01/01/19 2328 01/02/19 0318 01/02/19 0727 01/02/19 1138  GLUCAP 136* 157* 168* 218* 255*   Lipid Profile: No results for input(s): CHOL, HDL, LDLCALC, TRIG, CHOLHDL, LDLDIRECT in the last 72 hours. Thyroid Function Tests: No results for input(s): TSH, T4TOTAL, FREET4, T3FREE, THYROIDAB in the last 72 hours. Anemia Panel: No results for input(s): VITAMINB12, FOLATE, FERRITIN, TIBC, IRON, RETICCTPCT in the last 72 hours. Sepsis Labs: Recent Labs  Lab 12/31/18 0822 12/31/18 1731  PROCALCITON <0.10 0.96    Recent Results (from the past 240 hour(s))  SARS Coronavirus 2 (CEPHEID - Performed in [facility] hospital lab), Hosp Order     Status: None   Collection Time: 12/31/18  9:41 AM  Result Value Ref Range Status   SARS Coronavirus 2 NEGATIVE NEGATIVE Final     Comment: (NOTE) If result is NEGATIVE SARS-CoV-2 target nucleic acids are NOT DETECTED. The SARS-CoV-2 RNA is generally detectable in upper and lower  respiratory specimens during the acute phase of infection. The lowest  concentration of SARS-CoV-2 viral copies this assay can detect is 250  copies / mL. A negative result does not preclude SARS-CoV-2 infection  and should not be used as the sole basis for treatment or other  patient management decisions.  A negative result may occur with  improper specimen collection / handling, submission of specimen other  than nasopharyngeal swab, presence of viral mutation(s) within the  areas targeted by this assay, and inadequate number of viral copies  (<250 copies / mL). A negative result must be combined with clinical  observations, patient history, and epidemiological information. If result is POSITIVE SARS-CoV-2 target nucleic acids are DETECTED. The SARS-CoV-2 RNA is generally detectable in upper and lower  respiratory specimens dur ing the acute phase of infection.  Positive  results are indicative of active infection with SARS-CoV-2.  Clinical  correlation with patient history and other diagnostic information is  necessary to determine patient infection status.  Positive results do  not rule out bacterial infection or co-infection with other viruses. If result is PRESUMPTIVE POSTIVE SARS-CoV-2 nucleic acids MAY BE PRESENT.   A presumptive positive result was obtained on the submitted specimen  and confirmed on repeat testing.  While 2019 novel coronavirus  (SARS-CoV-2) nucleic acids may be present in the submitted sample  additional confirmatory testing may be necessary for epidemiological  and / or clinical management purposes  to differentiate between  SARS-CoV-2 and other Sarbecovirus currently known to infect humans.  If clinically indicated additional testing with an alternate test  methodology [phone]) is advised. The  SARS-CoV-2 RNA is generally  detectable in upper and lower respiratory sp ecimens during the acute  phase of infection. The expected result is Negative. Fact Sheet for Patients:  StrictlyIdeas.no Fact Sheet for Healthcare Providers: BankingDealers.co.za This test is not yet approved or cleared by the [place] FDA and has been authorized for detection and/or diagnosis of SARS-CoV-2 by FDA under an Emergency Use Authorization (EUA).  This EUA will remain in effect (meaning this test  can be used) for the duration of the COVID-19 declaration under Section 564(b)(1) of the Act, 21 U.S.C. section 360bbb-3(b)(1), unless the authorization is terminated or revoked sooner. Performed at [facility], [place] [address] [place], [place] [address]   MRSA PCR Screening     Status: None   Collection Time: 12/31/18  7:07 PM  Result Value Ref Range Status   MRSA by PCR NEGATIVE NEGATIVE Final    Comment:        The GeneXpert MRSA Assay (FDA approved for NASAL specimens only), is one component of a comprehensive MRSA colonization surveillance program. It is not intended to diagnose MRSA infection nor to guide or monitor treatment for MRSA infections. Performed at [facility], [place] [address] [place], [place] [address]   Culture, blood (routine x 2)     Status: None (Preliminary result)   Collection Time: 12/31/18  8:25 PM  Result Value Ref Range Status   Specimen Description BLOOD LEFT HAND  Final   Special Requests   Final    BOTTLES DRAWN AEROBIC ONLY Blood Culture results may not be optimal due to an inadequate volume of blood received in culture bottles   Culture   Final    NO GROWTH 2 DAYS Performed at [facility], [place] [address] [place], [place] [address]    Report Status PENDING  Incomplete  Culture, blood (routine x 2)     Status: None (Preliminary result)   Collection Time: 12/31/18  8:25 PM  Result Value Ref  Range Status   Specimen Description BLOOD RIGHT HAND  Final   Special Requests   Final    BOTTLES DRAWN AEROBIC ONLY Blood Culture adequate volume   Culture   Final    NO GROWTH 2 DAYS Performed at [facility], [place] [address] [place], [place] [address]    Report Status PENDING  Incomplete         Radiology Studies: Mr Jeri Cos Wo Contrast  Result Date: 01/01/2019 CLINICAL DATA:  Confusion with left-sided gaze and weakness EXAM: MRI HEAD WITHOUT AND WITH CONTRAST TECHNIQUE: Multiplanar, multiecho pulse sequences of the brain and surrounding structures were obtained without and with intravenous contrast. CONTRAST:  10 mL Gadavist COMPARISON:  None. FINDINGS: BRAIN: There is no acute infarct, acute hemorrhage or extra-axial collection. Left parafalcine 17 mm densely calcified meningioma. No midline shift or other mass effect. Mild white matter hyperintensity, most commonly due to chronic ischemic microangiopathy, though not unexpected for age. The cerebral and cerebellar volume are age-appropriate. No hydrocephalus. Susceptibility-sensitive sequences show no chronic microhemorrhage or superficial siderosis. No abnormal parenchymal contrast enhancement VASCULAR: The major intracranial arterial and venous sinus flow voids are normal. SKULL AND UPPER CERVICAL SPINE: Calvarial bone marrow signal is normal. There is no skull base mass. Visualized upper cervical spine and soft tissues are normal. SINUSES/ORBITS: No fluid levels or advanced mucosal thickening. Left-greater-than-right mastoid fluid. The orbits are normal. IMPRESSION: 1. No acute intracranial abnormality or specific etiology for seizure. 2. Densely calcified lesion along the falx cerebri, likely calcified meningioma. No abnormality of the underlying parenchyma. Electronically Signed   By: Ulyses Jarred M.D.   On: 01/01/2019 00:08   Dg Chest Port 1 View  Result Date: 01/01/2019 CLINICAL DATA:  Check endotracheal tube placement EXAM:  PORTABLE CHEST 1 VIEW COMPARISON:  12/31/2018 FINDINGS: Cardiac shadow is stable. Endotracheal tube and gastric catheter are noted in satisfactory position. Focal eventration of the right hemidiaphragm is noted.  No focal infiltrate or sizable effusion is seen. No acute bony abnormality is noted. IMPRESSION: Tubes and lines in satisfactory position. Improved aeration in the bases bilaterally. Electronically Signed   By: Inez Catalina M.D.   On: 01/01/2019 07:29        Scheduled Meds:  arformoterol  15 mcg Nebulization BID   budesonide (PULMICORT) nebulizer solution  0.5 mg Nebulization BID   Chlorhexidine Gluconate Cloth  6 each Topical Daily   heparin  5,000 Units Subcutaneous Q8H   insulin aspart  0-20 Units Subcutaneous Q4H   insulin glargine  12 Units Subcutaneous Daily   pantoprazole  40 mg Oral Daily   Continuous Infusions:  ampicillin-sulbactam (UNASYN) IV 3 g (01/02/19 1301)   lactated ringers 10 mL/hr at 01/01/19 2000   phenytoin (DILANTIN) IV Stopped (01/02/19 1610)     LOS: 2 days    Time spent: 40 min    Little Ishikawa, DO [facility] Pager [phone]  If 7PM-7AM, please contact night-coverage [email or web address] Password [email or web address] 01/02/2019, 2:58 PM

## 2019-01-02 NOTE — Progress Notes (Signed)
Inpatient Diabetes Program Recommendations  AACE/ADA: New Consensus Statement on Inpatient Glycemic Control (2015)  Target Ranges:  Prepandial:   less than 140 mg/dL      Peak postprandial:   less than 180 mg/dL (1-2 hours)      Critically ill patients:  140 - 180 mg/dL     Review of Glycemic Control  Spoke with patient over the phone in regards to glucose control. Patient reports seeing Dr. Meredith Pel, Endocrinologist, 2 weeks ago and was started on the clonidine patch. Patient reports being on trulicity weekly, Glimepiride 4 mg bid, and another med that starts with a d that she can't remember the name of. Patient also uses the [employer] 14 day CGM Sensor. Patient has sensor located on arm but does not have te reader with her to track her glucose levels.  Patient reports having 2 oral surgeries within the past 3 months and being placed on steroids both of those times. Spoke with patient about her glucose trends and calling Dr. Meredith Pel in the future with help to control glucose levels on times she is on medication like that. Discussed glucose and A1c goals. Patient reports her A1c and glucose average is much lower than the 9.7% this admission.  Discussed current glucose trends while inpatient and that our goal is to control her glucose in the 100 range. Discussed why we use insulin versus oral medications in the hospital.  Patient has PCP and Endocrinology for DM follow up. Patient states she has plenty of her DM medication and supplies at time of d/c.  Thanks,  Tama Headings RN, MSN, BC-ADM Inpatient Diabetes Coordinator Team Pager [phone] (8a-5p)

## 2019-01-03 DIAGNOSIS — R404 Transient alteration of awareness: Secondary | ICD-10-CM

## 2019-01-03 LAB — GLUCOSE, CAPILLARY
Glucose-Capillary: 113 mg/dL — ABNORMAL HIGH (ref 70–99)
Glucose-Capillary: 124 mg/dL — ABNORMAL HIGH (ref 70–99)
Glucose-Capillary: 161 mg/dL — ABNORMAL HIGH (ref 70–99)
Glucose-Capillary: 202 mg/dL — ABNORMAL HIGH (ref 70–99)
Glucose-Capillary: 211 mg/dL — ABNORMAL HIGH (ref 70–99)
Glucose-Capillary: 28 mg/dL — CL (ref 70–99)

## 2019-01-03 LAB — BASIC METABOLIC PANEL
Anion gap: 11 (ref 5–15)
BUN: 16 mg/dL (ref 8–23)
CO2: 26 mmol/L (ref 22–32)
Calcium: 8 mg/dL — ABNORMAL LOW (ref 8.9–10.3)
Chloride: 101 mmol/L (ref 98–111)
Creatinine, Ser: 1.63 mg/dL — ABNORMAL HIGH (ref 0.44–1.00)
GFR calc Af Amer: 36 mL/min — ABNORMAL LOW (ref >60)
GFR calc non Af Amer: 31 mL/min — ABNORMAL LOW (ref >60)
Glucose, Bld: 119 mg/dL — ABNORMAL HIGH (ref 70–99)
Potassium: 4.1 mmol/L (ref 3.5–5.1)
Sodium: 138 mmol/L (ref 135–145)

## 2019-01-03 LAB — CBC
HCT: 33.3 % — ABNORMAL LOW (ref 36.0–46.0)
Hemoglobin: 10.3 g/dL — ABNORMAL LOW (ref 12.0–15.0)
MCH: 25.7 pg — ABNORMAL LOW (ref 26.0–34.0)
MCHC: 30.9 g/dL (ref 30.0–36.0)
MCV: 83 fL (ref 80.0–100.0)
Platelets: 192 10*3/uL (ref 150–400)
RBC: 4.01 MIL/uL (ref 3.87–5.11)
RDW: 14.6 % (ref 11.5–15.5)
WBC: 15.8 10*3/uL — ABNORMAL HIGH (ref 4.0–10.5)
nRBC: 0 % (ref 0.0–0.2)

## 2019-01-03 MED ORDER — METOPROLOL SUCCINATE ER 50 MG PO TB24: 50.0000 mg | ORAL_TABLET | Freq: Every day | ORAL | Status: DC

## 2019-01-03 MED ORDER — FUROSEMIDE 40 MG PO TABS
40.0000 mg | ORAL_TABLET | Freq: Every day | ORAL | Status: DC
  Administered 2019-01-03 – 2019-01-05 (×3): 40 mg via ORAL
  Filled 2019-01-03 (×3): qty 1

## 2019-01-03 MED ORDER — DEXTROSE 50 % IV SOLN
INTRAVENOUS | Status: AC
  Administered 2019-01-04: 50 mL
  Filled 2019-01-03: qty 50

## 2019-01-03 MED ORDER — CARVEDILOL 12.5 MG PO TABS
12.5000 mg | ORAL_TABLET | Freq: Two times a day (BID) | ORAL | Status: DC
  Administered 2019-01-03 – 2019-01-05 (×5): 12.5 mg via ORAL
  Filled 2019-01-03 (×5): qty 1

## 2019-01-03 MED ORDER — AMOXICILLIN-POT CLAVULANATE 875-125 MG PO TABS
1.0000 | ORAL_TABLET | Freq: Two times a day (BID) | ORAL | Status: DC
  Administered 2019-01-03 – 2019-01-05 (×4): 1 via ORAL
  Filled 2019-01-03 (×4): qty 1

## 2019-01-03 NOTE — Progress Notes (Signed)
SATURATION QUALIFICATIONS: (This note is used to comply with regulatory documentation for home oxygen)  Patient Saturations on Room Air at Rest = 88%  Patient Saturations on Room Air while Ambulating = 78%  Patient Saturations on 2L = 92%  Please briefly explain why patient needs home oxygen: Pt is unable to maintain SpO2 above 90% without oxygen.

## 2019-01-03 NOTE — Progress Notes (Signed)
RT offered pt CPAP dream station for the night. Pt declined stating she did not want it tonight. RT expressed to pt that if she changes her mind during the night to have the RN call for placement. RT will continue to monitor.

## 2019-01-03 NOTE — Progress Notes (Signed)
PROGRESS NOTE    Sharon Stout  QBH:[id number] DOB: [birth year] DOA: 12/31/2018 PCP: Luberta Mutter, MD (Confirm with patient/family/NH records and if not entered, this HAS to be entered at [facility] point of entry. "No PCP" if truly none.)   Brief Narrative: 73 year old female with prior history of asthma, diastolic HF, HTN, HLD, DMT2, gout, OSA on CPAP, and osteoarthritis presenting from home LSW around 2100 last night (5/31). Patient lives with her daughter, [name].  She states patient has complained of in  intermittent headache for weeks.  She was changed from clonidine pill to clonidine patch last week.  Daughter states she is compliant with her other medications.  Denies ETOH or drug abuse.  Patient does chew tobacco.  Daughter also reports patient recently had a tooth pulled and completed PCN abx. She woke-up this morning complaining of worsening headache.  Daughter states she vomited 2-3 times, bilious/ non-bloody.  She also noticed she had a left gaze but really didn't notice any specific weakness given that she was able to stand.  Her daughter thought she seems confused and checked her blood pressure and found that it was high and called EMS given her symptoms were very similar to those in May 2019. Was admitted in May 2019 at [facility] with PRES. Recently started on clonidine patch for her blood pressure. She reportedly was found with left gaze and left sided weakness with EMS with witnessed tonic clonic seizure that abated on its own lasting for 1-2 minutes. Glucose 300.  Initial blood pressure in ER was 240/93.  Was taken for emergent head CT where she had a second tonic clonic seizure. Given poor/ no IV access was given IM Ativan and emergent central placed. Neurology seeing, fosphenytoin ordered given initial concern for stroke versus status epilepticus.  She remained altered and hypoxic unable to protect airway and therefore intubated. Initially sedated on propofol but was switched to  precedex.  Labs noted for sCr 1.57, WBC 13.2, negative ETOH,  PCCM called for admission.   Patient's mental status drastically with blood pressure 12 hours, back to baseline mental status AO x4 without any ongoing deficits.  Assessment & Plan:   Active Problems:   Acute encephalopathy   Hypertensive emergency   Altered mental status   Seizure-like activity ([place])  Hypertensive emergency in the setting of above, resolving Questionably etiology for above onset seizure Transition to carvedilol/furosemide (avoid clonidine if able) Patient has self-reported allergies to amlodipine and ACE inhibitors with swelling noted Patient declines any discontinuation, poor compliance or recent changes with PCP of her regimen  Acute hypoxic respiratory failure in the setting of above,poa, Improving Concurrent aspiration PNA/Pneumonitis, improving In the setting of above, now resolving given mental status back to baseline Continue to wean oxygen as tolerated Transition from Unasyn to Augmentin to complete 5-day course  Metabolic encephalopathy with multiple witnessed seizures 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 Keppra G Continue PT OT and speech evaluation Todd's paralysis previously resolved  continue seizure prophylaxis/precautions  CKD 3 At baseline, continue to follow with morning labs  Heart failure, diastolic function - unknown EF not in acute exacerbation Unclear EF, no echo here in house, no current indication for echo given patient appears euvolemic, continue home medications  Non-insulin-dependent diabetes type 2 Continue sliding scale insulin and hypoglycemic protocol  DVT prophylaxis: Heparin Code Status: Full  Disposition Plan: Pending resolution of hypoxia, weakness, tolerance of p.o. antiepileptic medications without  recurrence of seizure.  Tentative disposition in the next 24 to  48 hours pending clinical course and resolution of symptoms as above.  Consultants:  Neurology  Subjective: No acute issues or events overnight, patient declines chest pain, nausea, vomiting, diarrhea, constipation, headache, fevers, chills.  Objective: Vitals:   01/03/19 1001 01/03/19 1002 01/03/19 1003 01/03/19 1337  BP:    (!) 154/61  Pulse:    80  Resp:    20  Temp:    (!) 97.5 F (36.4 C)  TempSrc:      SpO2: (!) 78% 92% 92% 96%  Weight:      Height:        Intake/Output Summary (Last 24 hours) at 01/03/2019 1519 Last data filed at 01/03/2019 1100 Gross per 24 hour  Intake 621.26 ml  Output --  Net 621.26 ml   Filed Weights   12/31/18 0920 01/02/19 0403 01/03/19 0500  Weight: 128 kg 124.7 kg 132.7 kg    Examination:  General:  Pleasantly resting in bed, No acute distress. HEENT:  Normocephalic atraumatic.  Sclerae nonicteric, noninjected.  Extraocular movements intact bilaterally. Neck:  Without mass or deformity.  Trachea is midline. Lungs:  Clear to auscultate bilaterally without rhonchi, wheeze, or rales. Heart:  Regular rate and rhythm.  Without murmurs, rubs, or gallops. Abdomen:  Soft, nontender, nondistended.  Without guarding or rebound. Extremities: Without cyanosis, clubbing, edema, or obvious deformity. Vascular:  Dorsalis pedis and posterior tibial pulses palpable bilaterally. Skin:  Warm and dry, no erythema, no ulcerations.  Data Reviewed: I have personally reviewed following labs and imaging studies  CBC: Recent Labs  Lab 12/31/18 0822  12/31/18 1125 12/31/18 1601 01/01/19 0610 01/02/19 0141 01/03/19 0353  WBC 13.2*  --   --   --  19.9* 20.0* 15.8*  NEUTROABS 9.0*  --   --   --   --   --   --   HGB 12.3   < > 13.3 11.9* 11.1* 10.3* 10.3*  HCT 40.6   < > 39.0 35.0* 35.9* 33.9* 33.3*  MCV 84.8  --   --   --  83.5 83.5 83.0  PLT 227  --   --   --  211 183 192   < > = values in this interval not displayed.   Basic Metabolic Panel: Recent  Labs  Lab 12/31/18 0822 12/31/18 0834 12/31/18 1125 12/31/18 1601 01/01/19 0610 01/02/19 0141 01/03/19 0353  NA 138 138 139 140 139 139 138  K 3.7 3.6 4.7 3.9 3.4* 4.7 4.1  CL 97* 98  --   --  97* 100 101  CO2 27  --   --   --  23 23 26   GLUCOSE 335* 336*  --   --  352* 201* 119*  BUN 15 18  --   --  18 20 16   CREATININE 1.57* 1.40*  --   --  1.91* 1.85* 1.63*  CALCIUM 9.2  --   --   --  8.4* 8.1* 8.0*  MG  --   --   --   --  1.3*  --   --   PHOS  --   --   --   --  3.5  --   --    GFR: Estimated Creatinine Clearance: 41.7 mL/min (A) (by C-G formula based on SCr of 1.63 mg/dL (H)). Liver Function Tests: Recent Labs  Lab 12/31/18 0822 01/01/19 0610 01/02/19 0141  AST 22  --  33  ALT 16  --  16  ALKPHOS 98  --  79  BILITOT 0.6  --  0.9  PROT 8.1  --  7.2  ALBUMIN 3.7 3.0* 3.1*   No results for input(s): LIPASE, AMYLASE in the last 168 hours. No results for input(s): AMMONIA in the last 168 hours. Coagulation Profile: Recent Labs  Lab 12/31/18 0822  INR 1.1   Cardiac Enzymes: No results for input(s): CKTOTAL, CKMB, CKMBINDEX, TROPONINI in the last 168 hours. BNP (last 3 results) No results for input(s): PROBNP in the last 8760 hours. HbA1C: Recent Labs    01/01/19 0610  HGBA1C 9.7*   CBG: Recent Labs  Lab 01/02/19 1935 01/02/19 2311 01/03/19 0423 01/03/19 0804 01/03/19 1137  GLUCAP 131* 107* 113* 124* 161*   Lipid Profile: No results for input(s): CHOL, HDL, LDLCALC, TRIG, CHOLHDL, LDLDIRECT in the last 72 hours. Thyroid Function Tests: No results for input(s): TSH, T4TOTAL, FREET4, T3FREE, THYROIDAB in the last 72 hours. Anemia Panel: No results for input(s): VITAMINB12, FOLATE, FERRITIN, TIBC, IRON, RETICCTPCT in the last 72 hours. Sepsis Labs: Recent Labs  Lab 12/31/18 0822 12/31/18 1731  PROCALCITON <0.10 0.96    Recent Results (from the past 240 hour(s))  SARS Coronavirus 2 (CEPHEID - Performed in [facility] hospital lab), Hosp Order      Status: None   Collection Time: 12/31/18  9:41 AM  Result Value Ref Range Status   SARS Coronavirus 2 NEGATIVE NEGATIVE Final    Comment: (NOTE) If result is NEGATIVE SARS-CoV-2 target nucleic acids are NOT DETECTED. The SARS-CoV-2 RNA is generally detectable in upper and lower  respiratory specimens during the acute phase of infection. The lowest  concentration of SARS-CoV-2 viral copies this assay can detect is 250  copies / mL. A negative result does not preclude SARS-CoV-2 infection  and should not be used as the sole basis for treatment or other  patient management decisions.  A negative result may occur with  improper specimen collection / handling, submission of specimen other  than nasopharyngeal swab, presence of viral mutation(s) within the  areas targeted by this assay, and inadequate number of viral copies  (<250 copies / mL). A negative result must be combined with clinical  observations, patient history, and epidemiological information. If result is POSITIVE SARS-CoV-2 target nucleic acids are DETECTED. The SARS-CoV-2 RNA is generally detectable in upper and lower  respiratory specimens dur ing the acute phase of infection.  Positive  results are indicative of active infection with SARS-CoV-2.  Clinical  correlation with patient history and other diagnostic information is  necessary to determine patient infection status.  Positive results do  not rule out bacterial infection or co-infection with other viruses. If result is PRESUMPTIVE POSTIVE SARS-CoV-2 nucleic acids MAY BE PRESENT.   A presumptive positive result was obtained on the submitted specimen  and confirmed on repeat testing.  While 2019 novel coronavirus  (SARS-CoV-2) nucleic acids may be present in the submitted sample  additional confirmatory testing may be necessary for epidemiological  and / or clinical management purposes  to differentiate between  SARS-CoV-2 and other Sarbecovirus currently known to  infect humans.  If clinically indicated additional testing with an alternate test  methodology [phone]) is advised. The SARS-CoV-2 RNA is generally  detectable in upper and lower respiratory sp ecimens during the acute  phase of infection. The expected result is Negative. Fact Sheet for Patients:  StrictlyIdeas.no Fact Sheet for Healthcare Providers: BankingDealers.co.za This test is not yet approved  or cleared by the [place] and has been authorized for detection and/or diagnosis of SARS-CoV-2 by FDA under an Emergency Use Authorization (EUA).  This EUA will remain in effect (meaning this test can be used) for the duration of the COVID-19 declaration under Section 564(b)(1) of the Act, 21 U.S.C. section 360bbb-3(b)(1), unless the authorization is terminated or revoked sooner. Performed at [facility], [place] [address], [place], [place] [address]   MRSA PCR Screening     Status: None   Collection Time: 12/31/18  7:07 PM  Result Value Ref Range Status   MRSA by PCR NEGATIVE NEGATIVE Final    Comment:        The GeneXpert MRSA Assay (FDA approved for NASAL specimens only), is one component of a comprehensive MRSA colonization surveillance program. It is not intended to diagnose MRSA infection nor to guide or monitor treatment for MRSA infections. Performed at [facility], [place] [address], [place], [place] [address]   Culture, blood (routine x 2)     Status: None (Preliminary result)   Collection Time: 12/31/18  8:25 PM  Result Value Ref Range Status   Specimen Description BLOOD LEFT HAND  Final   Special Requests   Final    BOTTLES DRAWN AEROBIC ONLY Blood Culture results may not be optimal due to an inadequate volume of blood received in culture bottles   Culture   Final    NO GROWTH 3 DAYS Performed at [facility], [place] [address], [place], [place] [address]    Report Status PENDING  Incomplete    Culture, blood (routine x 2)     Status: None (Preliminary result)   Collection Time: 12/31/18  8:25 PM  Result Value Ref Range Status   Specimen Description BLOOD RIGHT HAND  Final   Special Requests   Final    BOTTLES DRAWN AEROBIC ONLY Blood Culture adequate volume   Culture   Final    NO GROWTH 3 DAYS Performed at [facility], [address], [place], [place] [address]    Report Status PENDING  Incomplete         Radiology Studies: No results found.      Scheduled Meds:  amoxicillin-clavulanate  1 tablet Oral Q12H   arformoterol  15 mcg Nebulization BID   budesonide (PULMICORT) nebulizer solution  0.5 mg Nebulization BID   carvedilol  12.5 mg Oral BID WC   Chlorhexidine Gluconate Cloth  6 each Topical Daily   furosemide  40 mg Oral Daily   heparin  5,000 Units Subcutaneous Q8H   insulin aspart  0-20 Units Subcutaneous Q4H   insulin glargine  12 Units Subcutaneous Daily   levETIRAcetam  500 mg Oral BID   pantoprazole  40 mg Oral Daily   Continuous Infusions:  lactated ringers Stopped (01/02/19 2126)     LOS: 3 days    Time spent: 40 min    Little Ishikawa, DO [facility] Pager [phone]  If 7PM-7AM, please contact night-coverage [email or web address] Password [facility] 01/03/2019, 3:19 PM

## 2019-01-03 NOTE — Progress Notes (Signed)
Pharmacy Antibiotic Note  Sharon Stout is a 73 y.o. female admitted on 12/31/2018 with likely aspiration pneumonia.  Pharmacy has been consulted for Unasyn dosing.   Unasyn dosing remains appropriate at this time. Noted plans for 5 days duration per MD note - since started late on 6/1, this would likely be thru 6/6.  Plan: - Continue Unasyn 3g q6h - Will follow-up stop date to complete a 5d course - Will continue to follow renal function, culture results, LOT, and antibiotic de-escalation plans   Height: 5\' 4"  (162.6 cm) Weight: 292 lb 8.8 oz (132.7 kg) IBW/kg (Calculated) : 54.7  Temp (24hrs), Avg:98.6 F (37 C), Min:98.3 F (36.8 C), Max:99 F (37.2 C)  Recent Labs  Lab 12/31/18 0822 12/31/18 0834 01/01/19 0610 01/02/19 0141 01/03/19 0353  WBC 13.2*  --  19.9* 20.0* 15.8*  CREATININE 1.57* 1.40* 1.91* 1.85* 1.63*    Estimated Creatinine Clearance: 41.7 mL/min (A) (by C-G formula based on SCr of 1.63 mg/dL (H)).    Allergies  Allergen Reactions  . Amlodipine Swelling  . Lisinopril Other (See Comments)    Tongue edema 01 Apr 2011 Tongue edema 01 Apr 2011     6/1 Unasyn>>  6/1 Bcx >>ngtd 6/1 MRSA PCR >> neg 6/1 COVID >> neg  Thank you for allowing pharmacy to be a part of this patient's care.  Alycia Rossetti, PharmD, BCPS Clinical Pharmacist Clinical phone for 01/03/2019: [phone] 01/03/2019 10:03 AM   **Pharmacist phone directory can now be found on [email or web address] (PW [email or web address]).  Listed under [place].

## 2019-01-03 NOTE — Progress Notes (Signed)
Occupational Therapy Treatment Patient Details Name: Sharon Stout MRN: [id number] DOB: 1946/05/30 Today's Date: 01/03/2019    History of present illness Pt is a 73 y.o. F with significant PMH of PRES in 11/2017, HTN, DM, diastolic HF, gout, CKD stage 3, obesity, who presents with new confusion and left sided gaze and weakness. Had 3 witnessed seizures concerning for status epilepticus and noted to be hypertensive.    OT comments  Pt progressing well. Pt unable to perform LB dressing due to large body habitus. Pt performing grooming and toileting with toilet hygiene needs of modA ans minguardA grooming at sink in standing. Pt preferred to sit down for grooming tasks at sink and sit to stands with minA overall. Pt fatigues easily with visible SOB. Pt ambulating with Rw in room with minguardA and requiring assist for external support. Pt would greatly benefit from continued OT skilled services for ADL, mobility and safety in [facility] setting.   Pt requiring O2. Pt desats to 78% on RA after exertion 30' in room with functional tasks; 2L O2 >92%  and 1L O2 after resting >92%. on RA after rest, pt was 88% unable to reach >90% with pursed lip breathing.  RN aware of this data.    Follow Up Recommendations  Home health OT;Supervision/Assistance - 24 hour    Equipment Recommendations  3 in 1 bedside commode(bariatric)    Recommendations for Other Services      Precautions / Restrictions Precautions Precautions: Fall Restrictions Weight Bearing Restrictions: No       Mobility Bed Mobility Overal bed mobility: Needs Assistance Bed Mobility: Supine to Sit     Supine to sit: Min assist     General bed mobility comments: MinA to pull up to sitting edge of bed  Transfers Overall transfer level: Needs assistance Equipment used: Rolling walker (2 wheeled) Transfers: Sit to/from [facility] Sit to Stand: Min assist Stand pivot transfers: Min assist            Balance  Overall balance assessment: Needs assistance   Sitting balance-Leahy Scale: Fair       Standing balance-Leahy Scale: Poor Standing balance comment: reliant on external support                           ADL either performed or assessed with clinical judgement   ADL Overall ADL's : Needs assistance/impaired     Grooming: Min guard;Standing                   Toilet Transfer: Minimal assistance;Comfort height toilet;Grab bars;RW   Toileting- Clothing Manipulation and Hygiene: Moderate assistance;Cueing for safety;Cueing for sequencing;Sitting/lateral lean;Sit to/from stand       Functional mobility during ADLs: Minimal assistance;Rolling walker General ADL Comments: Pt performing grooming and toilet hygiene tasks. pt easily fatigued, with generalized weakness and requires min safety cues during mobility and ADL tasks      Vision       Perception     Praxis      Cognition Arousal/Alertness: Awake/alert Behavior During Therapy: WFL for tasks assessed/performed Overall Cognitive Status: Impaired/Different from baseline                                 General Comments: A&Ox3 (not oriented to situation). Very pleasant, following commands consistently. Needs cues for safety        Exercises  Shoulder Instructions       General Comments Pt requiring O2. Pt desats to 78% on RA after exertion 30' in room with functional tasks; 2l O2 >92%  and 1L O2 after resting >92%. on RA after rest, pt was 88% unable to reach >90% with pursed lip breathing.     Pertinent Vitals/ Pain       Pain Assessment: Faces Faces Pain Scale: No hurt Pain Intervention(s): Monitored during session  Home Living                                          Prior Functioning/Environment              Frequency  Min 2X/week        Progress Toward Goals  OT Goals(current goals can now be found in the care plan section)  Progress towards  OT goals: Progressing toward goals  Acute Rehab OT Goals Patient Stated Goal: "get stronger." OT Goal Formulation: With patient Time For Goal Achievement: 01/16/19 Potential to Achieve Goals: Good ADL Goals Pt Will Perform Grooming: with modified independence;sitting Pt Will Perform Lower Body Bathing: with supervision;sit to/from stand Pt Will Perform Lower Body Dressing: with supervision;sit to/from stand Pt Will Transfer to Toilet: with supervision;ambulating;stand pivot transfer Pt Will Perform Toileting - Clothing Manipulation and hygiene: with supervision;sit to/from stand  Plan Discharge plan remains appropriate    Co-evaluation                 AM-PAC OT "6 Clicks" Daily Activity     Outcome Measure   Help from another person eating meals?: None Help from another person taking care of personal grooming?: A Little Help from another person toileting, which includes using toliet, bedpan, or urinal?: A Little Help from another person bathing (including washing, rinsing, drying)?: A Little Help from another person to put on and taking off regular upper body clothing?: A Little Help from another person to put on and taking off regular lower body clothing?: A Lot 6 Click Score: 18    End of Session Equipment Utilized During Treatment: Gait belt;Rolling walker  OT Visit Diagnosis: Muscle weakness (generalized) (M62.81);Unsteadiness on feet (R26.81);Other symptoms and signs involving the nervous system (R29.898)   Activity Tolerance Patient tolerated treatment well   Patient Left in chair;with call bell/phone within reach;with chair alarm set   Nurse Communication Mobility status        Time: 1610-9604 OT Time Calculation (min): 43 min  Charges: OT General Charges $OT Visit: 1 Visit OT Treatments $Self Care/Home Management : 38-52 mins  Darryl Nestle) Marsa Aris OTR/L Acute Rehabilitation Services Pager: [phone] Office: [place] 01/03/2019, 4:23 PM

## 2019-01-04 LAB — BASIC METABOLIC PANEL
Anion gap: 9 (ref 5–15)
BUN: 18 mg/dL (ref 8–23)
CO2: 28 mmol/L (ref 22–32)
Calcium: 8.2 mg/dL — ABNORMAL LOW (ref 8.9–10.3)
Chloride: 98 mmol/L (ref 98–111)
Creatinine, Ser: 1.7 mg/dL — ABNORMAL HIGH (ref 0.44–1.00)
GFR calc Af Amer: 34 mL/min — ABNORMAL LOW (ref >60)
GFR calc non Af Amer: 29 mL/min — ABNORMAL LOW (ref >60)
Glucose, Bld: 123 mg/dL — ABNORMAL HIGH (ref 70–99)
Potassium: 4 mmol/L (ref 3.5–5.1)
Sodium: 135 mmol/L (ref 135–145)

## 2019-01-04 LAB — GLUCOSE, CAPILLARY
Glucose-Capillary: 125 mg/dL — ABNORMAL HIGH (ref 70–99)
Glucose-Capillary: 125 mg/dL — ABNORMAL HIGH (ref 70–99)
Glucose-Capillary: 144 mg/dL — ABNORMAL HIGH (ref 70–99)
Glucose-Capillary: 145 mg/dL — ABNORMAL HIGH (ref 70–99)
Glucose-Capillary: 151 mg/dL — ABNORMAL HIGH (ref 70–99)
Glucose-Capillary: 163 mg/dL — ABNORMAL HIGH (ref 70–99)

## 2019-01-04 LAB — CBC
HCT: 33.6 % — ABNORMAL LOW (ref 36.0–46.0)
Hemoglobin: 10.1 g/dL — ABNORMAL LOW (ref 12.0–15.0)
MCH: 25.5 pg — ABNORMAL LOW (ref 26.0–34.0)
MCHC: 30.1 g/dL (ref 30.0–36.0)
MCV: 84.8 fL (ref 80.0–100.0)
Platelets: 202 10*3/uL (ref 150–400)
RBC: 3.96 MIL/uL (ref 3.87–5.11)
RDW: 14.6 % (ref 11.5–15.5)
WBC: 13.5 10*3/uL — ABNORMAL HIGH (ref 4.0–10.5)
nRBC: 0 % (ref 0.0–0.2)

## 2019-01-04 MED ORDER — GERHARDT'S BUTT CREAM
TOPICAL_CREAM | Freq: Three times a day (TID) | CUTANEOUS | Status: DC | PRN
  Filled 2019-01-04: qty 1

## 2019-01-04 MED ORDER — ISOSORBIDE DINITRATE 5 MG PO TABS
5.0000 mg | ORAL_TABLET | Freq: Two times a day (BID) | ORAL | Status: DC
  Administered 2019-01-04 – 2019-01-05 (×2): 5 mg via ORAL
  Filled 2019-01-04 (×3): qty 1

## 2019-01-04 MED ORDER — LOPERAMIDE HCL 2 MG PO CAPS
4.0000 mg | ORAL_CAPSULE | Freq: Once | ORAL | Status: AC
  Administered 2019-01-04: 4 mg via ORAL
  Filled 2019-01-04: qty 2

## 2019-01-04 NOTE — Progress Notes (Addendum)
Inpatient Diabetes Program Recommendations  AACE/ADA: New Consensus Statement on Inpatient Glycemic Control (2015)  Target Ranges:  Prepandial:   less than 140 mg/dL      Peak postprandial:   less than 180 mg/dL (1-2 hours)      Critically ill patients:  140 - 180 mg/dL   Lab Results  Component Value Date   GLUCAP 151 (H) 01/04/2019   HGBA1C 9.7 (H) 01/01/2019    Review of Glycemic Control Results for Sharon Stout, Sharon Stout (MRN [id number]) as of 01/04/2019 12:12  Ref. Range 01/03/2019 23:57 01/04/2019 00:16 01/04/2019 05:15 01/04/2019 08:37  Glucose-Capillary Latest Ref Range: 70 - 99 mg/dL 28 (LL) 145 (H) 144 (H) 125 (H)   Diabetes history: Type 2 DM Outpatient Diabetes medications: Trulicity 2.54 Q/wk, Glimepiride 4 mg BID Current orders for Inpatient glycemic control: Novolog 0-20 units Q4H, Lantus 12 units QD  Inpatient Diabetes Program Recommendations:    Noted severe hypoglycemia of 28 mg/dL following Novolog 7 units on 6/4. Consider decreasing correction to Novolog 0-9 units TID (now that patient has diet order).  Given the amount of short acting given in last 24 hours, patient could benefit from increase to basal: Lantus 14 units QD. Secure chat sent to Dr Avon Gully regarding recommendations, awaiting changes.  Addendum 1400: Spoke with patient regarding insulin administration. Patient gives herself Trulicity injections and is open to insulin pens if needed. She questions the need for insulin, given that she had recently been on oral steroids for dental health.  We reviewed the impact of steroids to glucose trends as well as current inpatient glucose trends and the amount of insulin she is receiving. Reviewed survival skills including hypoglycemia and interventions. Patient is able to state appropriately. Stressed the importance of close follow up with Dr Meredith Pel, endocrinology. Patient also encouraged to discuss with physician.  @1430 -Spoke with patient's daughter, Sharon Stout per patient's  permission. Reviewed survival skills, interventions and the importance of follow up with endo. Also, discussed glucose tabs, signs and symptoms of hypoglycemia vs hyperglycemia and when to call the MD. Communicated patient's fear of starting insulin given previous steroid use and also reviewed impatient insulin needs. Again, reinforce need for close follow up after discharge. Daughter expresses concern for hypoglycemic event on 6/4 and interventions. Reviewed documentation and noted that MD had been contacted regarding insulin changes. Additionally, sent daughter DM videos per email. She was appreciative and has no further questions at this time.  DM coordinator to demonstrate insulin pen this afternoon.  Addendum@1550 : Secure chat sent to Dr Avon Gully regarding recommendations.    Thanks, Bronson Curb, MSN, RNC-OB Diabetes Coordinator [phone] (8a-5p)

## 2019-01-04 NOTE — Progress Notes (Signed)
Pt refused CPAP for tonight.  Machine not left in room.  RT will continue to monitor.

## 2019-01-04 NOTE — Evaluation (Signed)
Clinical/Bedside Swallow Evaluation Patient Details  Name: Mariapaula Krist MRN: [id number] Date of Birth: August 19, 1945  Today's Date: 01/04/2019 Time: SLP Start Time (ACUTE ONLY): 4098 SLP Stop Time (ACUTE ONLY): 1424 SLP Time Calculation (min) (ACUTE ONLY): 19 min  Past Medical History:  Past Medical History:  Diagnosis Date  . Arthritis   . Asthma   . Diabetes mellitus without complication ([place])   . Hypertension   . Sleep apnea    Past Surgical History:  Past Surgical History:  Procedure Laterality Date  . diastolic HF    . gout    . hyperlipidemia     HPI:  73 year old female with prior history of asthma, diastolic HF, HTN, HLD, DMT2, gout, OSA on CPAP, and osteoarthritis presenting from home on 12/31/2018.   Pt intubated from 12/31/18-01/01/19 and placed on clear liquids; BSE ordered to assess swallow function.  Assessment / Plan / Recommendation Clinical Impression   Pt given various consistencies of thin via straw-solids without overt s/s of aspiration noted during entire BSE; pt exhibited a minimally hoarse vocal quality prior to the BSE and vocal quality remained the same throughout evaluation; recommend advancing to a regular/thin liquid diet and ST will f/u for diet tolerance while in acute setting d/t pt report of possible esophageal concerns with hx of EGD and recent intubation; thank you for this consult. SLP Visit Diagnosis: Dysphagia, unspecified (R13.10)    Aspiration Risk  Mild aspiration risk    Diet Recommendation   Regular/thin liquids  Medication Administration: Whole meds with liquid    Other  Recommendations Oral Care Recommendations: Oral care BID   Follow up Recommendations None      Frequency and Duration min 1 x/week  1 week       Prognosis Prognosis for Safe Diet Advancement: Good      Swallow Study   General Date of Onset: 12/31/18 HPI: 73 year old female with prior history of asthma, diastolic HF, HTN, HLD, DMT2, gout, OSA on CPAP, and  osteoarthritis presenting from home LSW around 2100 last night (5/31).   Type of Study: Bedside Swallow Evaluation Previous Swallow Assessment: (n/a) Diet Prior to this Study: Thin liquids Temperature Spikes Noted: No Respiratory Status: Nasal cannula History of Recent Intubation: Yes Length of Intubations (days): 1 days(6/1-01/01/19) Date extubated: 01/01/19 Behavior/Cognition: Alert;Cooperative;Pleasant mood Oral Cavity Assessment: Within Functional Limits Oral Care Completed by SLP: No Oral Cavity - Dentition: Adequate natural dentition;Missing dentition Vision: Functional for self-feeding Self-Feeding Abilities: Able to feed self Patient Positioning: Upright in chair Baseline Vocal Quality: Hoarse(minimally from recent intubation) Volitional Cough: Other (Comment)(not observed d/t Covid-19 restrictions) Volitional Swallow: Able to elicit    Oral/Motor/Sensory Function Overall Oral Motor/Sensory Function: Within functional limits   Ice Chips Ice chips: Not tested   Thin Liquid Thin Liquid: Within functional limits Presentation: Cup;Straw    Nectar Thick Nectar Thick Liquid: Not tested   Honey Thick Honey Thick Liquid: Not tested   Puree Puree: Within functional limits Presentation: Self Fed   Solid     Solid: Within functional limits Presentation: Self Fed      Elvina Sidle, M.S., CCC-SLP 01/04/2019,2:46 PM

## 2019-01-04 NOTE — Progress Notes (Signed)
Physical Therapy Treatment Patient Details Name: Sharon Stout MRN: [id number] DOB: Aug 08, 1945 Today's Date: 01/04/2019    History of Present Illness Pt is a 73 y.o. F with significant PMH of PRES in 11/2017, HTN, DM, diastolic HF, gout, CKD stage 3, obesity, who presents with new confusion and left sided gaze and weakness. Had 3 witnessed seizures concerning for status epilepticus and noted to be hypertensive.   Pt admitted for hypertensive urgency and Acute hypoxic respiratory failure     PT Comments    Pt assisted with changing gown (wet from urinating in bed) and ambulated in hallway short distance.  Pt fatigued quickly.  SPO2 94% at rest room air. SPO2 89-91% room air with ambulating.  SPO2 94% room air end of session.  Recommend initial assist upon d/c for pt safety.   Follow Up Recommendations  Home health PT;Supervision/Assistance - 24 hour     Equipment Recommendations  None recommended by PT    Recommendations for Other Services       Precautions / Restrictions Precautions Precautions: Fall Precaution Comments: monitor sats    Mobility  Bed Mobility Overal bed mobility: Needs Assistance Bed Mobility: Supine to Sit     Supine to sit: Min guard;HOB elevated     General bed mobility comments: pt able to self asssist to sitting EOB, utilized bed rail  Transfers Overall transfer level: Needs assistance Equipment used: None Transfers: Sit to/from Stand Sit to Stand: Min guard         General transfer comment: min/guard for safety, pt able to stand and peform front pericare without UE support (assist provided for posterior pericare)  Ambulation/Gait Ambulation/Gait assistance: Min assist Gait Distance (Feet): 60 Feet Assistive device: Rolling walker (2 wheeled) Gait Pattern/deviations: Step-through pattern;Decreased stride length;Wide base of support;Trunk flexed Gait velocity: decreased   General Gait Details: verbal cues for posture and RW positioning, pt  tends to have flexed posture and remain outside of walker, SpO2 89-91% upon ambulating into room on room air   Stairs             Wheelchair Mobility    Modified Rankin (Stroke Patients Only)       Balance                                            Cognition Arousal/Alertness: Awake/alert Behavior During Therapy: WFL for tasks assessed/performed Overall Cognitive Status: No family/caregiver present to determine baseline cognitive functioning                                 General Comments: A&Ox3 Pt reports being "wet".  Purewick in place however linen soiled with urine.  Pt appropriate during session.  Very pleasant, following commands consistently.       Exercises      General Comments        Pertinent Vitals/Pain Pain Assessment: Faces Faces Pain Scale: Hurts a little bit Pain Location: vaginal region, reports likely due to catheter Pain Descriptors / Indicators: Discomfort Pain Intervention(s): Monitored during session;Repositioned    Home Living                      Prior Function            PT Goals (current goals can now be found in the care  plan section) Progress towards PT goals: Progressing toward goals    Frequency    Min 3X/week      PT Plan Current plan remains appropriate    Co-evaluation              AM-PAC PT "6 Clicks" Mobility   Outcome Measure  Help needed turning from your back to your side while in a flat bed without using bedrails?: None Help needed moving from lying on your back to sitting on the side of a flat bed without using bedrails?: A Little Help needed moving to and from a bed to a chair (including a wheelchair)?: A Little Help needed standing up from a chair using your arms (e.g., wheelchair or bedside chair)?: A Little Help needed to walk in hospital room?: A Little Help needed climbing 3-5 steps with a railing? : A Lot 6 Click Score: 18    End of Session    Activity Tolerance: Patient tolerated treatment well Patient left: in chair;with call bell/phone within reach(with SLP)   PT Visit Diagnosis: Muscle weakness (generalized) (M62.81);Difficulty in walking, not elsewhere classified (R26.2)     Time: 2025-4270 PT Time Calculation (min) (ACUTE ONLY): 18 min  Charges:  $Gait Training: 8-22 mins                     Carmelia Bake, PT, DPT Acute Rehabilitation Services Office: [phone] Pager: [phone]  Trena Platt 01/04/2019, 2:47 PM

## 2019-01-04 NOTE — Progress Notes (Signed)
Hypoglycemic Event  CBG: 28  Treatment: D50 25 mL (12.5 gm)  Symptoms: Sweaty and Shaky and AMS   Follow-up CBG: Time:0016 CBG Result:145  Possible Reasons for Event: Inadequate meal intake  Comments/MD notified: [name], NP    [name]

## 2019-01-04 NOTE — Progress Notes (Signed)
  Demonstrated use of insulin pen including applying needle, Novolog 2 unit prime, dialing dose, injection sites, rotation of sites, and administration of dose (holding in place for 6-10 seconds).  Patient verbalized understanding.  She was able to hold insulin pen and look at how it works.  She does not have her glasses and thus was unable to see the numbers, however she states she sees well with her glasses.  We discussed hypoglycemia signs and symptoms.  Patient states that she had a low blood sugar last night of 28 mg/dL and that she drank 4 oz of grape juice.  She states she "felt like I was going out of here".  We discussed signs and symptoms of low blood sugars and importance of drinking 4 oz juice, regular soda or glucose tablets. Also stressed importance of rechecking blood sugar 15 minutes after treatment and treating if less than 70 mg/dL.  Patient verbalized understanding.  Diabetes coordinator also called patient's daughter to discuss and sent video links for use of insulin pen, etc.    Thanks,  Adah Perl, RN, BC-ADM Inpatient Diabetes Coordinator Pager [phone] (8a-5p)

## 2019-01-04 NOTE — Discharge Instructions (Signed)
Insulin Injection Instructions, Using Insulin Pens, Adult  A subcutaneous injection is a shot of medicine that is injected into the layer of fat and tissue between skin and muscle. People with type 1 diabetes must take insulin because their bodies do not make it. People with type 2 diabetes may need to take insulin.  There are many different types of insulin. The type of insulin that you take may determine how many injections you give yourself and when you need to give the injections.  Supplies needed:   Soap and water to wash hands.   Your insulin pen.   A new, unused needle.   Alcohol wipes.   A disposal container that is meant for sharp items (sharps container), such as an empty plastic bottle with a cover.  How to choose a site for injection  The body absorbs insulin differently, depending on where the insulin is injected (injection site). It is best to inject insulin into the same body area each time (for example, always in the abdomen), but you should use a different spot in that area for each injection. Do not inject the insulin in the same spot each time. There are five main areas that can be used for injecting. These areas include:   Abdomen. This is the preferred area.   Front of thigh.   Upper, outer side of thigh.   Upper, outer side of arm.   Upper, outer part of buttock.  How to use an insulin pen    First, follow the steps for Get ready, then continue with the steps for Inject the insulin.  Get ready  1. Wash your hands with soap and water. If soap and water are not available, use hand sanitizer.  2. Before you give yourself an insulin injection, be sure to test your blood sugar level (blood glucose level) and write down that number. Follow any instructions from your health care provider about what to do if your blood glucose level is higher or lower than your normal range.  3. Check the expiration date and the type of insulin that is in the pen.  4. If you are using CLEAR insulin, check to  see that it is clear and free of clumps.  5. If you are using CLOUDY insulin, do not shake the pen to get the injection ready. Instead, get it ready in one of these ways:  ? Gently roll the pen between your palms several times.  ? Tip the pen up and down several times.  6. Remove the cap from the insulin pen.  7. Use an alcohol wipe to clean the rubber tip of the pen.  8. Remove the protective paper tab from the disposable needle. Do not let the needle touch anything.  9. Screw a new, unused needle onto the pen.  10. Remove the outer plastic needle cover. Do not throw away the outer plastic cover yet.  ? If the pen uses a special safety needle, leave the inner needle shield in place.  ? If the pen does not use a special safety needle, remove the inner plastic cover from the needle.  11. Follow the manufacturer's instructions to prime the insulin pen with the volume of insulin needed. Hold the pen with the needle pointing up, and push the button on the opposite end of the pen until a drop of insulin appears at the needle tip. If no insulin appears, repeat this step.  12. Turn the button (dial) to the number of units of   insulin that you will be injecting.  Inject the insulin  1. Use an alcohol wipe to clean the site where you will be injecting the needle. Let the site air-dry.  2. Hold the pen in the palm of your writing hand like a pencil.  3. If directed by your health care provider, use your other hand to pinch and hold about an inch (2.5 cm) of skin at the injection site. Do not directly touch the cleaned part of the skin.  4. Gently but quickly, use your writing hand to put the needle straight into the skin. The needle should be at a 90-degree angle (perpendicular) to the skin.  5. When the needle is completely inserted into the skin, use your thumb or index finger of your writing hand to push the top button of the pen down all the way to inject the insulin.  6. Let go of the skin that you are pinching. Continue  to hold the pen in place with your writing hand.  7. Wait 10 seconds, then pull the needle straight out of the skin. This will allow all of the insulin to go from the pen and needle into your body.  8. Carefully put the larger (outer) plastic cover of the needle back over the needle, then unscrew the capped needle and discard it in a sharps container, such as an empty plastic bottle with a cover.  9. Put the plastic cap back on the insulin pen.  How to throw away supplies   Discard all used needles in a puncture-proof sharps disposal container. You can ask your local pharmacy about where you can get this kind of disposal container, or you can use an empty plastic liquid laundry detergent bottle that has a cover.   Follow the disposal regulations for the area where you live. Do not use any needle more than one time.   Throw away empty disposable pens in the regular trash.  Questions to ask your health care provider   How often should I be taking insulin?   How often should I check my blood glucose?   What amount of insulin should I be taking at each time?   What are the side effects?   What should I do if my blood glucose is too high?   What should I do if my blood glucose is too low?   What should I do if I forget to take my insulin?   What number should I call if I have questions?  Where to find more information   American Diabetes Association (ADA): www.diabetes.org   American Association of Diabetes Educators (AADE) Patient Resources: https://www.diabeteseducator.org  Summary   A subcutaneous injection is a shot of medicine that is injected into the layer of fat and tissue between skin and muscle.   Before you give yourself an insulin injection, be sure to test your blood sugar level (blood glucose level) and write down that number.   Check the expiration date and the type of insulin that is in the pen. The type of insulin that you take may determine how many injections you give yourself and when  you need to give the injections.   It is best to inject insulin into the same body area each time (for example, always in the abdomen), but you should use a different spot in that area for each injection.  This information is not intended to replace advice given to you by your health care provider. Make sure you discuss   any questions you have with your health care provider.  Document Released: 08/21/2015 Document Revised: 08/07/2017 Document Reviewed: 08/21/2015  [employer] Interactive Patient Education  2019 [employer]

## 2019-01-04 NOTE — Progress Notes (Signed)
Patient ambulated from the bed to the chair with assistance. Pt  sat on the  chair through the the shift oxygen sat 93% on room air . No complaints of shortness of breath when asked. Pt glucose been stable to no low blood sugars noted. Pt educated on signs and symptoms of low blood sugar.

## 2019-01-04 NOTE — Progress Notes (Signed)
PROGRESS NOTE    Sharon Stout  PJK:[id number] DOB: [birth year] DOA: 12/31/2018 PCP: Luberta Mutter, MD   Brief Narrative: 73 year old female with prior history of asthma, diastolic HF, HTN, HLD, DMT2, gout, OSA on CPAP, and osteoarthritis presenting from home LSW around 2100 last night (5/31). Patient lives with her daughter, Sharon Stout.  She states patient has complained of in  intermittent headache for weeks.  She was changed from clonidine pill to clonidine patch last week.  Daughter states she is compliant with her other medications.  Denies ETOH or drug abuse.  Patient does chew tobacco.  Daughter also reports patient recently had a tooth pulled and completed PCN abx. She woke-up this morning complaining of worsening headache.  Daughter states she vomited 2-3 times, bilious/ non-bloody.  She also noticed she had a left gaze but really didn't notice any specific weakness given that she was able to stand.  Her daughter thought she seems confused and checked her blood pressure and found that it was high and called EMS given her symptoms were very similar to those in May 2019. Was admitted in May 2019 at [facility] with PRES. Recently started on clonidine patch for her blood pressure. She reportedly was found with left gaze and left sided weakness with EMS with witnessed tonic clonic seizure that abated on its own lasting for 1-2 minutes. Glucose 300.  Initial blood pressure in ER was 240/93.  Was taken for emergent head CT where she had a second tonic clonic seizure. Given poor/ no IV access was given IM Ativan and emergent central placed. Neurology seeing, fosphenytoin ordered given initial concern for stroke versus status epilepticus.  She remained altered and hypoxic unable to protect airway and therefore intubated. Initially sedated on propofol but was switched to precedex.  Labs noted for sCr 1.57, WBC 13.2, negative ETOH,  PCCM called for admission.   Patient's mental status drastically  improved with blood pressure improvement within 12 hours, back to baseline mental status AO x4 without any ongoing deficits.  Assessment & Plan:   Active Problems:   Acute encephalopathy   Hypertensive emergency   Altered mental status   Seizure-like activity ([place])  Hypertensive emergency in the setting of ? noncomplaince, resolving, POA Questionably etiology for acute onset seizure Transition to carvedilol/furosemide (avoid clonidine if able given ? Poor compliance) We will add isosorbide today, titrate as tolerated -goal blood pressure 120/80 Patient has self-reported allergies to amlodipine and ACEi with swelling noted for both Patient declines any discontinuation, poor compliance or recent changes with PCP of her regimen, although given her resolution of HTN with minimal medications this is unlikely  Acute hypoxic respiratory failure in the setting of above,poa, Improving Concurrent aspiration PNA/Pneumonitis,POA improving In the setting of above, now resolving given mental status back to baseline Continue to wean oxygen as tolerated Continue Augmentin to complete 5-day course for pneumonitis/aspiration PNA  Metabolic encephalopathy with multiple 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 Keppra Continue PT OT and speech evaluation Todd's paralysis previously resolved  continue seizure prophylaxis/precautions  CKD 3 stable At baseline, continue to follow with morning labs  Heart failure, diastolic function - unknown EF not in acute exacerbation Unclear EF, no echo here in house or on care everywhere, no current indication for echo given patient appears euvolemic, continue home medications  Non-insulin-dependent diabetes type 2, poorly controlled Transitioning to insulin-dependent diabetes at this point Transient episode  of hypoglycemia overnight,  resolving with increased p.o. intake  Indicates poor p.o. intake yesterday late -likely the cause of her hypoglycemia  A1C 9.7 consistent with poorly controlled diabetes Continue sliding scale insulin and hypoglycemic protocol Attempting to transition to long-acting insulin currently on 12 units  DVT prophylaxis: Heparin Code Status: Full  Disposition Plan: Pending resolution of hypoxia, weakness, tolerance of p.o. antiepileptic medications without recurrence of seizure. Tentative disposition in the next 24 to 48 hours pending clinical course and resolution of symptoms as above.  Consultants:  Neurology  Subjective: No acute issues or events overnight, patient declines chest pain, nausea, vomiting, diarrhea, constipation, headache, fevers, chills.  Objective: Vitals:   01/04/19 0754 01/04/19 0842 01/04/19 1045 01/04/19 1604  BP:  (!) 143/64  (!) 134/58  Pulse:  69  83  Resp:  16    Temp:      TempSrc:      SpO2: 98% 100% 93%   Weight:      Height:        Intake/Output Summary (Last 24 hours) at 01/04/2019 1708 Last data filed at 01/04/2019 1530 Gross per 24 hour  Intake 974.99 ml  Output --  Net 974.99 ml   Filed Weights   01/02/19 0403 01/03/19 0500 01/04/19 0500  Weight: 124.7 kg 132.7 kg 128.5 kg    Examination:  General:  Pleasantly resting in bed, No acute distress. HEENT:  Normocephalic atraumatic.  Sclerae nonicteric, noninjected.  Extraocular movements intact bilaterally. Neck:  Without mass or deformity.  Trachea is midline. Lungs:  Clear to auscultate bilaterally without rhonchi, wheeze, or rales. Heart:  Regular rate and rhythm.  Without murmurs, rubs, or gallops. Abdomen:  Soft, nontender, nondistended.  Without guarding or rebound. Extremities: Without cyanosis, clubbing, edema, or obvious deformity. Vascular:  Dorsalis pedis and posterior tibial pulses palpable bilaterally. Skin:  Warm and dry, no erythema, no ulcerations.  Data Reviewed: I have  personally reviewed following labs and imaging studies  CBC: Recent Labs  Lab 12/31/18 0822  12/31/18 1601 01/01/19 0610 01/02/19 0141 01/03/19 0353 01/04/19 0250  WBC 13.2*  --   --  19.9* 20.0* 15.8* 13.5*  NEUTROABS 9.0*  --   --   --   --   --   --   HGB 12.3   < > 11.9* 11.1* 10.3* 10.3* 10.1*  HCT 40.6   < > 35.0* 35.9* 33.9* 33.3* 33.6*  MCV 84.8  --   --  83.5 83.5 83.0 84.8  PLT 227  --   --  211 183 192 202   < > = values in this interval not displayed.   Basic Metabolic Panel: Recent Labs  Lab 12/31/18 0822 12/31/18 0834  12/31/18 1601 01/01/19 0610 01/02/19 0141 01/03/19 0353 01/04/19 0250  NA 138 138   < > 140 139 139 138 135  K 3.7 3.6   < > 3.9 3.4* 4.7 4.1 4.0  CL 97* 98  --   --  97* 100 101 98  CO2 27  --   --   --  23 23 26 28   GLUCOSE 335* 336*  --   --  352* 201* 119* 123*  BUN 15 18  --   --  18 20 16 18   CREATININE 1.57* 1.40*  --   --  1.91* 1.85* 1.63* 1.70*  CALCIUM 9.2  --   --   --  8.4* 8.1* 8.0* 8.2*  MG  --   --   --   --  1.3*  --   --   --   PHOS  --   --   --   --  3.5  --   --   --    < > = values in this interval not displayed.   GFR: Estimated Creatinine Clearance: 39.2 mL/min (A) (by C-G formula based on SCr of 1.7 mg/dL (H)). Liver Function Tests: Recent Labs  Lab 12/31/18 0822 01/01/19 0610 01/02/19 0141  AST 22  --  33  ALT 16  --  16  ALKPHOS 98  --  79  BILITOT 0.6  --  0.9  PROT 8.1  --  7.2  ALBUMIN 3.7 3.0* 3.1*   No results for input(s): LIPASE, AMYLASE in the last 168 hours. No results for input(s): AMMONIA in the last 168 hours. Coagulation Profile: Recent Labs  Lab 12/31/18 0822  INR 1.1   Cardiac Enzymes: No results for input(s): CKTOTAL, CKMB, CKMBINDEX, TROPONINI in the last 168 hours. BNP (last 3 results) No results for input(s): PROBNP in the last 8760 hours. HbA1C: No results for input(s): HGBA1C in the last 72 hours. CBG: Recent Labs  Lab 01/04/19 0016 01/04/19 0515 01/04/19 0837  01/04/19 1159 01/04/19 1645  GLUCAP 145* 144* 125* 151* 163*   Lipid Profile: No results for input(s): CHOL, HDL, LDLCALC, TRIG, CHOLHDL, LDLDIRECT in the last 72 hours. Thyroid Function Tests: No results for input(s): TSH, T4TOTAL, FREET4, T3FREE, THYROIDAB in the last 72 hours. Anemia Panel: No results for input(s): VITAMINB12, FOLATE, FERRITIN, TIBC, IRON, RETICCTPCT in the last 72 hours. Sepsis Labs: Recent Labs  Lab 12/31/18 0822 12/31/18 1731  PROCALCITON <0.10 0.96    Recent Results (from the past 240 hour(s))  SARS Coronavirus 2 (CEPHEID - Performed in [facility] hospital lab), Hosp Order     Status: None   Collection Time: 12/31/18  9:41 AM  Result Value Ref Range Status   SARS Coronavirus 2 NEGATIVE NEGATIVE Final    Comment: (NOTE) If result is NEGATIVE SARS-CoV-2 target nucleic acids are NOT DETECTED. The SARS-CoV-2 RNA is generally detectable in upper and lower  respiratory specimens during the acute phase of infection. The lowest  concentration of SARS-CoV-2 viral copies this assay can detect is 250  copies / mL. A negative result does not preclude SARS-CoV-2 infection  and should not be used as the sole basis for treatment or other  patient management decisions.  A negative result may occur with  improper specimen collection / handling, submission of specimen other  than nasopharyngeal swab, presence of viral mutation(s) within the  areas targeted by this assay, and inadequate number of viral copies  (<250 copies / mL). A negative result must be combined with clinical  observations, patient history, and epidemiological information. If result is POSITIVE SARS-CoV-2 target nucleic acids are DETECTED. The SARS-CoV-2 RNA is generally detectable in upper and lower  respiratory specimens dur ing the acute phase of infection.  Positive  results are indicative of active infection with SARS-CoV-2.  Clinical  correlation with patient history and other diagnostic  information is  necessary to determine patient infection status.  Positive results do  not rule out bacterial infection or co-infection with other viruses. If result is PRESUMPTIVE POSTIVE SARS-CoV-2 nucleic acids MAY BE PRESENT.   A presumptive positive result was obtained on the submitted specimen  and confirmed on repeat testing.  While 2019 novel coronavirus  (SARS-CoV-2) nucleic acids may be present in the submitted sample  additional confirmatory testing may be  necessary for epidemiological  and / or clinical management purposes  to differentiate between  SARS-CoV-2 and other Sarbecovirus currently known to infect humans.  If clinically indicated additional testing with an alternate test  methodology [phone]) is advised. The SARS-CoV-2 RNA is generally  detectable in upper and lower respiratory sp ecimens during the acute  phase of infection. The expected result is Negative. Fact Sheet for Patients:  StrictlyIdeas.no Fact Sheet for Healthcare Providers: BankingDealers.co.za This test is not yet approved or cleared by the [place] FDA and has been authorized for detection and/or diagnosis of SARS-CoV-2 by FDA under an Emergency Use Authorization (EUA).  This EUA will remain in effect (meaning this test can be used) for the duration of the COVID-19 declaration under Section 564(b)(1) of the Act, 21 U.S.C. section 360bbb-3(b)(1), unless the authorization is terminated or revoked sooner. Performed at [facility], [place] [address], [place], [place] [address]   MRSA PCR Screening     Status: None   Collection Time: 12/31/18  7:07 PM  Result Value Ref Range Status   MRSA by PCR NEGATIVE NEGATIVE Final    Comment:        The GeneXpert MRSA Assay (FDA approved for NASAL specimens only), is one component of a comprehensive MRSA colonization surveillance program. It is not intended to diagnose MRSA infection nor to guide  or monitor treatment for MRSA infections. Performed at [facility], [place] [address], [place], [place] [address]   Culture, blood (routine x 2)     Status: None (Preliminary result)   Collection Time: 12/31/18  8:25 PM  Result Value Ref Range Status   Specimen Description BLOOD LEFT HAND  Final   Special Requests   Final    BOTTLES DRAWN AEROBIC ONLY Blood Culture results may not be optimal due to an inadequate volume of blood received in culture bottles   Culture   Final    NO GROWTH 4 DAYS Performed at [facility], [place] [address] [place], [place] [address]    Report Status PENDING  Incomplete  Culture, blood (routine x 2)     Status: None (Preliminary result)   Collection Time: 12/31/18  8:25 PM  Result Value Ref Range Status   Specimen Description BLOOD RIGHT HAND  Final   Special Requests   Final    BOTTLES DRAWN AEROBIC ONLY Blood Culture adequate volume   Culture   Final    NO GROWTH 4 DAYS Performed at [facility], [place] [address] [place], [place] [address]    Report Status PENDING  Incomplete         Radiology Studies: No results found.      Scheduled Meds:  amoxicillin-clavulanate  1 tablet Oral Q12H   arformoterol  15 mcg Nebulization BID   budesonide (PULMICORT) nebulizer solution  0.5 mg Nebulization BID   carvedilol  12.5 mg Oral BID WC   Chlorhexidine Gluconate Cloth  6 each Topical Daily   furosemide  40 mg Oral Daily   heparin  5,000 Units Subcutaneous Q8H   insulin aspart  0-20 Units Subcutaneous Q4H   insulin glargine  12 Units Subcutaneous Daily   levETIRAcetam  500 mg Oral BID   pantoprazole  40 mg Oral Daily   Continuous Infusions:  lactated ringers Stopped (01/02/19 2126)     LOS: 4 days    Time spent: 40 min    Little Ishikawa, DO [facility] Pager [phone]  If  7PM-7AM, please contact night-coverage [email or web address] Password [email or web address] 01/04/2019, 5:08 PM

## 2019-01-04 NOTE — Care Management Important Message (Signed)
Important Message  Patient Details  Name: Sharon Stout MRN: [id number] Date of Birth: 1946-04-09   Medicare Important Message Given:  Yes    Memory Argue 01/04/2019, 3:04 PM

## 2019-01-05 LAB — BASIC METABOLIC PANEL
Anion gap: 12 (ref 5–15)
BUN: 19 mg/dL (ref 8–23)
CO2: 24 mmol/L (ref 22–32)
Calcium: 8.4 mg/dL — ABNORMAL LOW (ref 8.9–10.3)
Chloride: 103 mmol/L (ref 98–111)
Creatinine, Ser: 1.71 mg/dL — ABNORMAL HIGH (ref 0.44–1.00)
GFR calc Af Amer: 34 mL/min — ABNORMAL LOW (ref >60)
GFR calc non Af Amer: 29 mL/min — ABNORMAL LOW (ref >60)
Glucose, Bld: 89 mg/dL (ref 70–99)
Potassium: 4.3 mmol/L (ref 3.5–5.1)
Sodium: 139 mmol/L (ref 135–145)

## 2019-01-05 LAB — CULTURE, BLOOD (ROUTINE X 2)
Culture: NO GROWTH
Culture: NO GROWTH
Special Requests: ADEQUATE

## 2019-01-05 LAB — CBC
HCT: 31.4 % — ABNORMAL LOW (ref 36.0–46.0)
Hemoglobin: 10 g/dL — ABNORMAL LOW (ref 12.0–15.0)
MCH: 25.8 pg — ABNORMAL LOW (ref 26.0–34.0)
MCHC: 31.8 g/dL (ref 30.0–36.0)
MCV: 80.9 fL (ref 80.0–100.0)
Platelets: 207 10*3/uL (ref 150–400)
RBC: 3.88 MIL/uL (ref 3.87–5.11)
RDW: 14.3 % (ref 11.5–15.5)
WBC: 10.8 10*3/uL — ABNORMAL HIGH (ref 4.0–10.5)
nRBC: 0 % (ref 0.0–0.2)

## 2019-01-05 LAB — GLUCOSE, CAPILLARY
Glucose-Capillary: 72 mg/dL (ref 70–99)
Glucose-Capillary: 83 mg/dL (ref 70–99)
Glucose-Capillary: 78 mg/dL (ref 70–99)
Glucose-Capillary: 124 mg/dL — ABNORMAL HIGH (ref 70–99)

## 2019-01-05 MED ORDER — AMOXICILLIN-POT CLAVULANATE 875-125 MG PO TABS: 1.0000 | ORAL_TABLET | Freq: Two times a day (BID) | ORAL | 0 refills | Status: AC

## 2019-01-05 MED ORDER — LEVETIRACETAM 500 MG PO TABS: 500.0000 mg | ORAL_TABLET | Freq: Two times a day (BID) | ORAL | 0 refills | Status: AC

## 2019-01-05 MED ORDER — ISOSORBIDE DINITRATE 5 MG PO TABS: 5.0000 mg | ORAL_TABLET | Freq: Two times a day (BID) | ORAL | 0 refills | Status: AC

## 2019-01-05 MED ORDER — CARVEDILOL 12.5 MG PO TABS: 12.5000 mg | ORAL_TABLET | Freq: Two times a day (BID) | ORAL | 0 refills | Status: AC

## 2019-01-05 NOTE — TOC Transition Note (Signed)
Transition of Care [facility]) - CM/SW Discharge Note   Patient Details  Name: Sharon Stout MRN: [id number] Date of Birth: Feb 25, 1946  [facility] of [facility]) CM/SW Contact:  Carles Collet, RN Phone Number: 01/05/2019, 3:48 PM   Clinical Narrative:    Patient from home, lives with daughter. Deferred HH choice to daughter. Reviewed Medicare ratings and would like to use [facility]. Referral accepted by [facility]. Daughter deferred 3/1. No other CM needs    Final next level of care: Home w Home Health Services Barriers to Discharge: No Barriers Identified   Patient Goals and CMS Choice Patient states their goals for this hospitalization and ongoing recovery are:: to return home CMS Medicare.gov Compare Post Acute Care list provided to:: Patient Choice offered to / list presented to : Patient, Adult Children  Discharge Placement                       Discharge Plan and Services     Post Acute Care Choice: [place]: PT [facility] Agency: [place] ([facility]) Date [place]: 01/05/19 Time [place]: 1548 Representative spoke with at [place]: [place] ([place]) Interventions     Readmission Risk Interventions No flowsheet data found.

## 2019-01-05 NOTE — Discharge Summary (Signed)
Physician Discharge Summary  Sharon Stout GYJ:[id number] DOB: 06-May-1946 DOA: 12/31/2018  PCP: Luberta Mutter, MD  Admit date: 12/31/2018 Discharge date: 01/05/2019  Admitted From: Home  Disposition: Home  Recommendations for Outpatient Follow-up:  1. Follow up with PCP in 1-2 weeks 2. Please obtain BMP/CBC in one week  Home Health: Yes Discharge Condition: Stable CODE STATUS: Full Diet recommendation: Low carb, heart healthy, low-salt, fluid restriction  Brief/Interim Summary: 73 year old female with prior history of asthma, diastolic HF, HTN, HLD, DMT2, gout,OSA on CPAP,and osteoarthritis presenting from home LSW around 2100 last night(5/31). Patient lives with her daughter, [name]. She states patient has complained of in intermittent headache for weeks. She was changed from clonidine pill to clonidine patch last week. Daughter states she is compliant with her other medications. Denies ETOH or drug abuse. Patient does chew tobacco. Daughter also reports patient recently had a tooth pulled and completed PCN abx. She woke-up this morning complaining of worsening headache. Daughter states she vomited 2-3 times, bilious/ non-bloody. She also noticed she had a left gaze but really didn't notice any specific weakness given that she was able to stand. Her daughter thought she seems confused and checked her blood pressure and found that it was high and called EMS given her symptoms were very similar to those in May 2019. Was admitted in May 2019 at [facility] with PRES.Recently started on clonidine patch for her blood pressure. She reportedly was found with left gaze and left sided weakness with EMS with witnessed tonic clonicseizurethat abated on its own lasting for 1-2 minutes. Glucose 300.Initial blood pressure in ER was 240/93. Was taken for emergenthead CT where she hadasecond tonic clonic seizure. Given poor/ no IV access was given IM Ativan and emergent central placed.  Neurology seeing, fosphenytoin ordered given initial concern for stroke versusstatus epilepticus. She remained altered and hypoxic unable to protect airway and therefore intubated. Initially sedated on propofol but was switched to precedex.Labs noted for sCr 1.57, WBC 13.2, negative ETOH, PCCM called for admission.   Patient admitted as above, patient's mental status drastically improved with blood pressure improvement within 12 hours, back to baseline mental status AO x4 without any ongoing deficits.  Patient was ANO x4 with very quickly, however remained markedly hypoxic needing prolonged oxygen weaning.  We have been able to transition patient off of clonidine increased her carvedilol and placed on isosorbide with remarkably well-controlled blood pressure at this time.  Patient also had what appears to be a seizure with resultant Todd's paralysis which is now resolved, continue Keppra per neurology recommendations.  During patient's altered mental status she likely had episode of aspiration pneumonitis versus pneumonia, continue Augmentin until completed for full course of pneumonia treatment.  Of note patient's glucose was markedly poorly controlled, resume home medications, of note her A1c of 9.7 at admission -will need close monitoring in the outpatient setting.  Stressed increased dietary control and medication compliance as well given her need for insulin.  Otherwise now stable and agreeable for discharge home, will require close follow-up with PCP, cardiology, neurology in the outpatient setting.  We continue to impress upon the patient the importance of continuation of medication and medication compliance, should she continue to be poorly compliant with markedly elevated blood pressures as she had with admission she is high risk for recurrent seizure, elevated morbidity and mortality.  Otherwise stable and agreeable for discharge home with home health at this time.   Discharge Diagnoses:   Active Problems:  Acute encephalopathy   Hypertensive emergency   Altered mental status   Seizure-like activity ([place])  Hypertensive emergency in the setting of ? noncomplaince, resolving, POA Questionably etiology for acute onset seizure Transition to carvedilol/furosemide (avoid clonidine if able given ? Poor compliance) We will add isosorbide today, titrate as tolerated -goal blood pressure 120/80 Patient has self-reported allergies to amlodipine and ACEi with swelling noted for both Patient declines any discontinuation, poor compliance or recent changes with PCP of her regimen, although given her resolution of HTN with minimal medications this is unlikely  Acute hypoxic respiratory failure in the setting of above,poa, Improving Concurrent aspiration PNA/Pneumonitis,POA improving In the setting of above, now resolving given mental status back to baseline Continue to wean oxygen as tolerated Continue Augmentin to complete 5-day course for pneumonitis/aspiration PNA  Metabolic encephalopathy with multiple 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 Keppra Continue PT OT and speech evaluation Todd's paralysis previously resolved  continue seizure prophylaxis/precautions  CKD 3 stable At baseline, continue to follow with morning labs  Heart failure, diastolic function - unknown EF not in acute exacerbation Unclear EF, no echo here in house or on care everywhere, no current indication for echo given patient appears euvolemic, continue home medications  Non-insulin-dependent diabetes type 2, poorly controlled Transitioning to insulin-dependent diabetes at this point A1C 9.7 consistent with poorly controlled diabetes Continue sliding scale insulin and hypoglycemic protocol Resume home medications at DC - close follow up with PCP  Discharge  Instructions  Discharge Instructions    Diet - low sodium heart healthy   Complete by:  As directed    Increase activity slowly   Complete by:  As directed      Allergies as of 01/05/2019      Reactions   Amlodipine Swelling   Lisinopril Other (See Comments)   Tongue edema 01 Apr 2011 Tongue edema 01 Apr 2011      Medication List    STOP taking these medications   cloNIDine 0.2 MG tablet Commonly known as:  CATAPRES   cloNIDine 0.3 mg/24hr patch Commonly known as:  CATAPRES - Dosed in mg/24 hr   metoprolol succinate 50 MG 24 hr tablet Commonly known as:  TOPROL-XL     TAKE these medications   acetaminophen 500 MG tablet Commonly known as:  TYLENOL Take 500 mg by mouth as needed.   allopurinol 100 MG tablet Commonly known as:  ZYLOPRIM Take 100 mg by mouth daily. Notes to patient:  6/7   amoxicillin-clavulanate 875-125 MG tablet Commonly known as:  AUGMENTIN Take 1 tablet by mouth every 12 (twelve) hours for 3 days.   aspirin EC 81 MG tablet Take 81 mg by mouth daily. Notes to patient:  6/7   budesonide-formoterol 160-4.5 MCG/ACT inhaler Commonly known as:  SYMBICORT Inhale 2 puffs into the lungs 2 (two) times a day. Notes to patient:  6/6   carvedilol 12.5 MG tablet Commonly known as:  COREG Take 1 tablet (12.5 mg total) by mouth 2 (two) times daily with a meal for 30 days.   fluticasone 50 MCG/ACT nasal spray Commonly known as:  FLONASE Place 1 spray into both nostrils as needed.   furosemide 40 MG tablet Commonly known as:  LASIX Take 40 mg by mouth daily. Notes to patient:  6/7   isosorbide dinitrate 5 MG tablet Commonly known as:  ISORDIL Take 1 tablet (5 mg total) by mouth 2 (two) times  daily for 30 days.   levETIRAcetam 500 MG tablet Commonly known as:  KEPPRA Take 1 tablet (500 mg total) by mouth 2 (two) times daily for 30 days.   pravastatin 20 MG tablet Commonly known as:  PRAVACHOL Take 20 mg by mouth at bedtime. Notes to patient:   6/6   traMADol 50 MG tablet Commonly known as:  ULTRAM Take 50 mg by mouth 2 (two) times a day. Notes to patient:  6/6   Trulicity 3.24 MW/1.0UV Sopn Generic drug:  Dulaglutide Inject 0.75 mg into the skin once a week. Friday Notes to patient:  Once a week on Friday       Allergies  Allergen Reactions  . Amlodipine Swelling  . Lisinopril Other (See Comments)    Tongue edema 01 Apr 2011 Tongue edema 01 Apr 2011     Procedures/Studies: Mr Jeri Cos OZ Contrast  Result Date: 01/01/2019 CLINICAL DATA:  Confusion with left-sided gaze and weakness EXAM: MRI HEAD WITHOUT AND WITH CONTRAST TECHNIQUE: Multiplanar, multiecho pulse sequences of the brain and surrounding structures were obtained without and with intravenous contrast. CONTRAST:  10 mL Gadavist COMPARISON:  None. FINDINGS: BRAIN: There is no acute infarct, acute hemorrhage or extra-axial collection. Left parafalcine 17 mm densely calcified meningioma. No midline shift or other mass effect. Mild white matter hyperintensity, most commonly due to chronic ischemic microangiopathy, though not unexpected for age. The cerebral and cerebellar volume are age-appropriate. No hydrocephalus. Susceptibility-sensitive sequences show no chronic microhemorrhage or superficial siderosis. No abnormal parenchymal contrast enhancement VASCULAR: The major intracranial arterial and venous sinus flow voids are normal. SKULL AND UPPER CERVICAL SPINE: Calvarial bone marrow signal is normal. There is no skull base mass. Visualized upper cervical spine and soft tissues are normal. SINUSES/ORBITS: No fluid levels or advanced mucosal thickening. Left-greater-than-right mastoid fluid. The orbits are normal. IMPRESSION: 1. No acute intracranial abnormality or specific etiology for seizure. 2. Densely calcified lesion along the falx cerebri, likely calcified meningioma. No abnormality of the underlying parenchyma. Electronically Signed   By: Ulyses Jarred M.D.   On:  01/01/2019 00:08   Dg Chest Port 1 View  Result Date: 01/01/2019 CLINICAL DATA:  Check endotracheal tube placement EXAM: PORTABLE CHEST 1 VIEW COMPARISON:  12/31/2018 FINDINGS: Cardiac shadow is stable. Endotracheal tube and gastric catheter are noted in satisfactory position. Focal eventration of the right hemidiaphragm is noted. No focal infiltrate or sizable effusion is seen. No acute bony abnormality is noted. IMPRESSION: Tubes and lines in satisfactory position. Improved aeration in the bases bilaterally. Electronically Signed   By: Inez Catalina M.D.   On: 01/01/2019 07:29   Dg Chest Portable 1 View  Result Date: 12/31/2018 CLINICAL DATA:  Endotracheal tube readjustment EXAM: PORTABLE CHEST 1 VIEW COMPARISON:  December 31, 2018 study obtained earlier in the day FINDINGS: Endotracheal tube tip abuts the carina. Orogastric tube tip and side port below the diaphragm with the side port noted in the stomach. No pneumothorax. There are small pleural effusions bilaterally with bibasilar atelectasis. Heart size within normal limits. No adenopathy. There is aortic atherosclerosis. No bone lesions. IMPRESSION: Endotracheal tube abuts the carina. Advise withdrawing endotracheal tube approximately 3-3.5 cm. Orogastric tube tip and side port below diaphragm. Bibasilar atelectasis with small pleural effusions bilaterally. Stable cardiac silhouette. Aortic Atherosclerosis (ICD10-I70.0). Electronically Signed   By: Lowella Grip III M.D.   On: 12/31/2018 10:32   Dg Chest Portable 1 View  Result Date: 12/31/2018 CLINICAL DATA:  Endotracheal and enteric tube placement. EXAM:  PORTABLE CHEST 1 VIEW COMPARISON:  None. FINDINGS: The patient is rotated to the left. Endotracheal tube in the right mainstem bronchus. Recommend retracting 4 cm. Enteric tube in the stomach with the tip below the field of view. Mild cardiomegaly. Low lung volumes are present, causing crowding of the pulmonary vasculature. Small bilateral pleural  effusions. Patchy opacity at the left lung base. No pneumothorax. No acute osseous abnormality. IMPRESSION: 1. Endotracheal tube in the right mainstem bronchus. Recommend retracting 4 cm. 2. Small bilateral pleural effusions. Left lower lobe atelectasis versus infiltrate. Critical Value/emergent results were called by telephone at the time of interpretation on 12/31/2018 at 10:02 am to Dr. Pattricia Boss , who verbally acknowledged these results. Electronically Signed   By: Titus Dubin M.D.   On: 12/31/2018 10:02   Ct Head Code Stroke Wo Contrast  Result Date: 12/31/2018 CLINICAL DATA:  Code stroke. 73 year old female with left side weakness and gaze, last seen normal 2030 hours. EXAM: CT HEAD WITHOUT CONTRAST TECHNIQUE: Contiguous axial images were obtained from the base of the skull through the vertex without intravenous contrast. COMPARISON:  None. FINDINGS: Brain: Round left para falcine calcified dura or meningioma measures about 17 millimeters diameter with no significant mass effect and no associated edema. Asymmetry of the lateral ventricles, but no midline shift or other evidence of intracranial mass effect. No acute intracranial hemorrhage identified. Gray-white matter differentiation remains normal. No changes of acute cortically based infarct identified. Vascular: Calcified atherosclerosis at the skull base. Intracranial ectasias. No suspicious intracranial vascular hyperdensity. Skull: No acute osseous abnormality identified. Sinuses/Orbits: Paranasal sinuses and mastoids are well pneumatized. Other: Leftward gaze deviation. No other acute orbit or scalp soft tissue finding. ASPECTS [facility] Stroke Program Early CT Score) - Ganglionic level infarction (caudate, lentiform nuclei, internal capsule, insula, M1-M3 cortex): 7 - Supraganglionic infarction (M4-M6 cortex): 3 Total score (0-10 with 10 being normal): 10 IMPRESSION: 1. No acute cortically based infarct or acute intracranial hemorrhage  identified. ASPECTS is 10. 2. 17 mm left para falcine dural calcification or calcified meningioma with no edema or significant mass effect. 3. These results were communicated to Dr. Lorraine Lax at 8:51 amon 12/31/2018 by text page via the [facility] messaging system. Electronically Signed   By: Genevie Ann M.D.   On: 12/31/2018 08:52     Subjective: No acute issues or events overnight, patient feels quite well, no longer hypoxic or dyspneic with exertion, denies chest pain, shortness of breath, nausea, vomiting, diarrhea, constipation, headache, fevers, chills.   Discharge Exam: Vitals:   01/05/19 0509 01/05/19 0858  BP: (!) 171/67   Pulse: 89   Resp:    Temp: 98.4 F (36.9 C)   SpO2: 96% 93%   Vitals:   01/04/19 2249 01/05/19 0509 01/05/19 0518 01/05/19 0858  BP: (!) 151/54 (!) 171/67    Pulse: 84 89    Resp:      Temp: 98.5 F (36.9 C) 98.4 F (36.9 C)    TempSrc: Oral Oral    SpO2: 90% 96%  93%  Weight:   130.3 kg   Height:        General:  Pleasantly resting in bed, No acute distress.  Toxic in appearance. HEENT:  Normocephalic atraumatic.  Sclerae nonicteric, noninjected.  Extraocular movements intact bilaterally. Neck:  Without mass or deformity.  Trachea is midline. Lungs:  Clear to auscultate bilaterally without rhonchi, wheeze, or rales. Heart:  Regular rate and rhythm.  Without murmurs, rubs, or gallops. Abdomen:  Soft, obese, nontender, nondistended.  Without guarding or rebound. Extremities: Without cyanosis, clubbing, edema, or obvious deformity. Vascular:  Dorsalis pedis and posterior tibial pulses palpable bilaterally. Skin:  Warm and dry, no erythema, no ulcerations.   The results of significant diagnostics from this hospitalization (including imaging, microbiology, ancillary and laboratory) are listed below for reference.     Microbiology: Recent Results (from the past 240 hour(s))  SARS Coronavirus 2 (CEPHEID - Performed in [place] hospital lab), Hosp Order      Status: None   Collection Time: 12/31/18  9:41 AM  Result Value Ref Range Status   SARS Coronavirus 2 NEGATIVE NEGATIVE Final    Comment: (NOTE) If result is NEGATIVE SARS-CoV-2 target nucleic acids are NOT DETECTED. The SARS-CoV-2 RNA is generally detectable in upper and lower  respiratory specimens during the acute phase of infection. The lowest  concentration of SARS-CoV-2 viral copies this assay can detect is 250  copies / mL. A negative result does not preclude SARS-CoV-2 infection  and should not be used as the sole basis for treatment or other  patient management decisions.  A negative result may occur with  improper specimen collection / handling, submission of specimen other  than nasopharyngeal swab, presence of viral mutation(s) within the  areas targeted by this assay, and inadequate number of viral copies  (<250 copies / mL). A negative result must be combined with clinical  observations, patient history, and epidemiological information. If result is POSITIVE SARS-CoV-2 target nucleic acids are DETECTED. The SARS-CoV-2 RNA is generally detectable in upper and lower  respiratory specimens dur ing the acute phase of infection.  Positive  results are indicative of active infection with SARS-CoV-2.  Clinical  correlation with patient history and other diagnostic information is  necessary to determine patient infection status.  Positive results do  not rule out bacterial infection or co-infection with other viruses. If result is PRESUMPTIVE POSTIVE SARS-CoV-2 nucleic acids MAY BE PRESENT.   A presumptive positive result was obtained on the submitted specimen  and confirmed on repeat testing.  While 2019 novel coronavirus  (SARS-CoV-2) nucleic acids may be present in the submitted sample  additional confirmatory testing may be necessary for epidemiological  and / or clinical management purposes  to differentiate between  SARS-CoV-2 and other Sarbecovirus currently known to  infect humans.  If clinically indicated additional testing with an alternate test  methodology [phone]) is advised. The SARS-CoV-2 RNA is generally  detectable in upper and lower respiratory sp ecimens during the acute  phase of infection. The expected result is Negative. Fact Sheet for Patients:  StrictlyIdeas.no Fact Sheet for Healthcare Providers: BankingDealers.co.za This test is not yet approved or cleared by the [place] FDA and has been authorized for detection and/or diagnosis of SARS-CoV-2 by FDA under an Emergency Use Authorization (EUA).  This EUA will remain in effect (meaning this test can be used) for the duration of the COVID-19 declaration under Section 564(b)(1) of the Act, 21 U.S.C. section 360bbb-3(b)(1), unless the authorization is terminated or revoked sooner. Performed at [facility], [place] [address], [place], [place] [address]   MRSA PCR Screening     Status: None   Collection Time: 12/31/18  7:07 PM  Result Value Ref Range Status   MRSA by PCR NEGATIVE NEGATIVE Final    Comment:        The GeneXpert MRSA Assay (FDA approved for NASAL specimens only), is one component of a comprehensive MRSA colonization surveillance program. It is not intended to  diagnose MRSA infection nor to guide or monitor treatment for MRSA infections. Performed at [facility], [place] [address] [place], [place] [address]   Culture, blood (routine x 2)     Status: None   Collection Time: 12/31/18  8:25 PM  Result Value Ref Range Status   Specimen Description BLOOD LEFT HAND  Final   Special Requests   Final    BOTTLES DRAWN AEROBIC ONLY Blood Culture results may not be optimal due to an inadequate volume of blood received in culture bottles   Culture   Final    NO GROWTH 5 DAYS Performed at [facility], [place] [address], [place], [place] [address]    Report Status 01/05/2019 FINAL  Final  Culture, blood  (routine x 2)     Status: None   Collection Time: 12/31/18  8:25 PM  Result Value Ref Range Status   Specimen Description BLOOD RIGHT HAND  Final   Special Requests   Final    BOTTLES DRAWN AEROBIC ONLY Blood Culture adequate volume   Culture   Final    NO GROWTH 5 DAYS Performed at [facility], [place] [address] [place], [place] [address]    Report Status 01/05/2019 FINAL  Final     Labs: BNP (last 3 results) Recent Labs    12/31/18 0822  BNP 254.2*   Basic Metabolic Panel: Recent Labs  Lab 01/01/19 0610 01/02/19 0141 01/03/19 0353 01/04/19 0250 01/05/19 0631  NA 139 139 138 135 139  K 3.4* 4.7 4.1 4.0 4.3  CL 97* 100 101 98 103  CO2 23 23 26 28 24   GLUCOSE 352* 201* 119* 123* 89  BUN 18 20 16 18 19   CREATININE 1.91* 1.85* 1.63* 1.70* 1.71*  CALCIUM 8.4* 8.1* 8.0* 8.2* 8.4*  MG 1.3*  --   --   --   --   PHOS 3.5  --   --   --   --    Liver Function Tests: Recent Labs  Lab 12/31/18 0822 01/01/19 0610 01/02/19 0141  AST 22  --  33  ALT 16  --  16  ALKPHOS 98  --  79  BILITOT 0.6  --  0.9  PROT 8.1  --  7.2  ALBUMIN 3.7 3.0* 3.1*   No results for input(s): LIPASE, AMYLASE in the last 168 hours. No results for input(s): AMMONIA in the last 168 hours. CBC: Recent Labs  Lab 12/31/18 0822  01/01/19 0610 01/02/19 0141 01/03/19 0353 01/04/19 0250 01/05/19 1201  WBC 13.2*  --  19.9* 20.0* 15.8* 13.5* 10.8*  NEUTROABS 9.0*  --   --   --   --   --   --   HGB 12.3   < > 11.1* 10.3* 10.3* 10.1* 10.0*  HCT 40.6   < > 35.9* 33.9* 33.3* 33.6* 31.4*  MCV 84.8  --  83.5 83.5 83.0 84.8 80.9  PLT 227  --  211 183 192 202 207   < > = values in this interval not displayed.   Cardiac Enzymes: No results for input(s): CKTOTAL, CKMB, CKMBINDEX, TROPONINI in the last 168 hours. BNP: Invalid input(s): POCBNP CBG: Recent Labs  Lab 01/04/19 2049 01/05/19 0102 01/05/19 0503 01/05/19 0823 01/05/19 1219  GLUCAP 125* 72 83 78 124*   D-Dimer No results for  input(s): DDIMER in the last 72 hours. Hgb A1c No results for input(s): HGBA1C in the last 72 hours. Lipid Profile No results for  input(s): CHOL, HDL, LDLCALC, TRIG, CHOLHDL, LDLDIRECT in the last 72 hours. Thyroid function studies No results for input(s): TSH, T4TOTAL, T3FREE, THYROIDAB in the last 72 hours.  Invalid input(s): FREET3 Anemia work up No results for input(s): VITAMINB12, FOLATE, FERRITIN, TIBC, IRON, RETICCTPCT in the last 72 hours. Urinalysis    Component Value Date/Time   COLORURINE STRAW (A) 12/31/2018 1053   APPEARANCEUR CLEAR 12/31/2018 1053   LABSPEC 1.012 12/31/2018 1053   PHURINE 6.0 12/31/2018 1053   GLUCOSEU >=500 (A) 12/31/2018 1053   HGBUR MODERATE (A) 12/31/2018 1053   BILIRUBINUR NEGATIVE 12/31/2018 1053   KETONESUR 5 (A) 12/31/2018 1053   PROTEINUR 100 (A) 12/31/2018 1053   NITRITE NEGATIVE 12/31/2018 1053   LEUKOCYTESUR TRACE (A) 12/31/2018 1053   Sepsis Labs Invalid input(s): PROCALCITONIN,  WBC,  LACTICIDVEN Microbiology Recent Results (from the past 240 hour(s))  SARS Coronavirus 2 (CEPHEID - Performed in [place] hospital lab), Hosp Order     Status: None   Collection Time: 12/31/18  9:41 AM  Result Value Ref Range Status   SARS Coronavirus 2 NEGATIVE NEGATIVE Final    Comment: (NOTE) If result is NEGATIVE SARS-CoV-2 target nucleic acids are NOT DETECTED. The SARS-CoV-2 RNA is generally detectable in upper and lower  respiratory specimens during the acute phase of infection. The lowest  concentration of SARS-CoV-2 viral copies this assay can detect is 250  copies / mL. A negative result does not preclude SARS-CoV-2 infection  and should not be used as the sole basis for treatment or other  patient management decisions.  A negative result may occur with  improper specimen collection / handling, submission of specimen other  than nasopharyngeal swab, presence of viral mutation(s) within the  areas targeted by this assay, and inadequate  number of viral copies  (<250 copies / mL). A negative result must be combined with clinical  observations, patient history, and epidemiological information. If result is POSITIVE SARS-CoV-2 target nucleic acids are DETECTED. The SARS-CoV-2 RNA is generally detectable in upper and lower  respiratory specimens dur ing the acute phase of infection.  Positive  results are indicative of active infection with SARS-CoV-2.  Clinical  correlation with patient history and other diagnostic information is  necessary to determine patient infection status.  Positive results do  not rule out bacterial infection or co-infection with other viruses. If result is PRESUMPTIVE POSTIVE SARS-CoV-2 nucleic acids MAY BE PRESENT.   A presumptive positive result was obtained on the submitted specimen  and confirmed on repeat testing.  While 2019 novel coronavirus  (SARS-CoV-2) nucleic acids may be present in the submitted sample  additional confirmatory testing may be necessary for epidemiological  and / or clinical management purposes  to differentiate between  SARS-CoV-2 and other Sarbecovirus currently known to infect humans.  If clinically indicated additional testing with an alternate test  methodology [id number]) is advised. The SARS-CoV-2 RNA is generally  detectable in upper and lower respiratory sp ecimens during the acute  phase of infection. The expected result is Negative. Fact Sheet for Patients:  StrictlyIdeas.no Fact Sheet for Healthcare Providers: BankingDealers.co.za This test is not yet approved or cleared by the [place] FDA and has been authorized for detection and/or diagnosis of SARS-CoV-2 by FDA under an Emergency Use Authorization (EUA).  This EUA will remain in effect (meaning this test can be used) for the duration of the COVID-19 declaration under Section 564(b)(1) of the Act, 21 U.S.C. section 360bbb-3(b)(1), unless the  authorization is terminated or  revoked sooner. Performed at [facility], [place] [address] [place], [place] [address]   MRSA PCR Screening     Status: None   Collection Time: 12/31/18  7:07 PM  Result Value Ref Range Status   MRSA by PCR NEGATIVE NEGATIVE Final    Comment:        The GeneXpert MRSA Assay (FDA approved for NASAL specimens only), is one component of a comprehensive MRSA colonization surveillance program. It is not intended to diagnose MRSA infection nor to guide or monitor treatment for MRSA infections. Performed at [facility], [place] [address], [place], [place] [address]   Culture, blood (routine x 2)     Status: None   Collection Time: 12/31/18  8:25 PM  Result Value Ref Range Status   Specimen Description BLOOD LEFT HAND  Final   Special Requests   Final    BOTTLES DRAWN AEROBIC ONLY Blood Culture results may not be optimal due to an inadequate volume of blood received in culture bottles   Culture   Final    NO GROWTH 5 DAYS Performed at [facility], [place] [address] [place], [place] [address]    Report Status 01/05/2019 FINAL  Final  Culture, blood (routine x 2)     Status: None   Collection Time: 12/31/18  8:25 PM  Result Value Ref Range Status   Specimen Description BLOOD RIGHT HAND  Final   Special Requests   Final    BOTTLES DRAWN AEROBIC ONLY Blood Culture adequate volume   Culture   Final    NO GROWTH 5 DAYS Performed at [facility], [place] [address], [place],  [address]    Report Status 01/05/2019 FINAL  Final     Time coordinating discharge: Over 30 minutes  SIGNED:   Little Ishikawa, DO [facility] 01/05/2019, 3:14 PM Pager   If 7PM-7AM, please contact night-coverage [email or web address] Password [email or web address]

## 2019-01-05 NOTE — Progress Notes (Signed)
Nsg Discharge Note  Admit Date:  12/31/2018 Discharge date: 01/05/2019   Tennis Must to be D/C'd home per MD order.  AVS completed.  Patient/caregiver able to verbalize understanding.  Discharge Medication: Allergies as of 01/05/2019      Reactions   Amlodipine Swelling   Lisinopril Other (See Comments)   Tongue edema 01 Apr 2011 Tongue edema 01 Apr 2011      Medication List    STOP taking these medications   cloNIDine 0.2 MG tablet Commonly known as:  CATAPRES   cloNIDine 0.3 mg/24hr patch Commonly known as:  CATAPRES - Dosed in mg/24 hr   metoprolol succinate 50 MG 24 hr tablet Commonly known as:  TOPROL-XL     TAKE these medications   acetaminophen 500 MG tablet Commonly known as:  TYLENOL Take 500 mg by mouth as needed.   allopurinol 100 MG tablet Commonly known as:  ZYLOPRIM Take 100 mg by mouth daily. Notes to patient:  Next dose due 6/7   amoxicillin-clavulanate 875-125 MG tablet Commonly known as:  AUGMENTIN Take 1 tablet by mouth every 12 (twelve) hours for 3 days. Notes to patient:   Next dose due 6/6   aspirin EC 81 MG tablet Take 81 mg by mouth daily. Notes to patient:   Next dose due  6/7   budesonide-formoterol 160-4.5 MCG/ACT inhaler Commonly known as:  SYMBICORT Inhale 2 puffs into the lungs 2 (two) times a day. Notes to patient:  Next dose due 6/6   carvedilol 12.5 MG tablet Commonly known as:  COREG Take 1 tablet (12.5 mg total) by mouth 2 (two) times daily with a meal for 30 days. Notes to patient:  Next dose due 6/6   fluticasone 50 MCG/ACT nasal spray Commonly known as:  FLONASE Place 1 spray into both nostrils as needed.   furosemide 40 MG tablet Commonly known as:  LASIX Take 40 mg by mouth daily. Notes to patient:  Next dose due 6/7   isosorbide dinitrate 5 MG tablet Commonly known as:  ISORDIL Take 1 tablet (5 mg total) by mouth 2 (two) times daily for 30 days. Notes to patient:  Next dose due 6/6   levETIRAcetam 500 MG  tablet Commonly known as:  KEPPRA Take 1 tablet (500 mg total) by mouth 2 (two) times daily for 30 days. Notes to patient:  Next dose due 6/6   pravastatin 20 MG tablet Commonly known as:  PRAVACHOL Take 20 mg by mouth at bedtime. Notes to patient:  Next dose due 6/6   traMADol 50 MG tablet Commonly known as:  ULTRAM Take 50 mg by mouth 2 (two) times a day. Notes to patient:  Next dose due 6/6    Trulicity 1.28 NO/6.7EH Sopn Generic drug:  Dulaglutide Inject 0.75 mg into the skin once a week. Friday Notes to patient:  Once a week on Friday       Discharge Assessment: Vitals:   01/05/19 0509 01/05/19 0858  BP: (!) 171/67   Pulse: 89   Resp:    Temp: 98.4 F (36.9 C)   SpO2: 96% 93%   Skin clean, dry and intact without evidence of skin break down, no evidence of skin tears noted. IV catheter discontinued intact. Site without signs and symptoms of complications - no redness or edema noted at insertion site, patient denies c/o pain - only slight tenderness at site.  Dressing with slight pressure applied.  D/c Instructions-Education: Discharge instructions given to patient/family with verbalized understanding. D/c education  completed with patient/family including follow up instructions, medication list, d/c activities limitations if indicated, with other d/c instructions as indicated by MD - patient able to verbalize understanding, all questions fully answered. Patient instructed to return to ED, call 911, or call MD for any changes in condition.  Patient escorted via [place], and D/C home via private auto.  Jaidan Prevette, Jolene Schimke, RN 01/05/2019 4:40 PM

## 2019-01-05 NOTE — Progress Notes (Signed)
SATURATION QUALIFICATIONS: (This note is used to comply with regulatory documentation for home oxygen)  Patient Saturations on Room Air at Rest = 94%  Patient Saturations on Room Air while Ambulating = 92%  

## 2020-04-28 IMAGING — MR MRI HEAD WITHOUT AND WITH CONTRAST
14 of 16 series · 40 of 48 positions shown · IV contrast (gadavist)
Comparison: None.

CLINICAL DATA: Confusion with left-sided gaze and weakness

EXAM:
MRI HEAD WITHOUT AND WITH CONTRAST
TECHNIQUE: Multiplanar, multiecho pulse sequences of the brain and surrounding
structures were obtained without and with intravenous contrast.
CONTRAST:  10 mL Gadavist

[Series 5: DWI · axial · 3.0mm · 0.88mm/px · z∈[-93,+44]mm · 5 of 94 slices shown (1 of 4)]
[im 1/94]
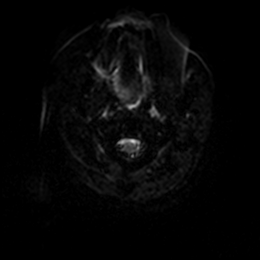
[im 24/94]
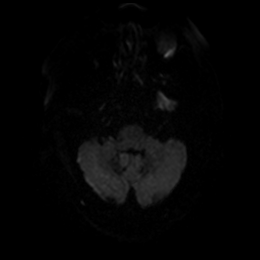
[im 47/94]
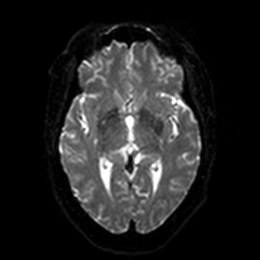
[im 70/94]
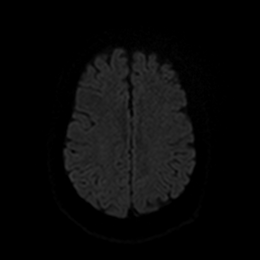
[im 94/94]
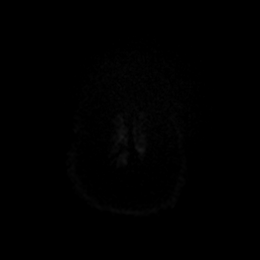

[Series 6: DWI · axial · 3.0mm · 0.88mm/px · z∈[-93,+44]mm · 3 of 47 slices shown (2 of 4)]
[im 1/47]
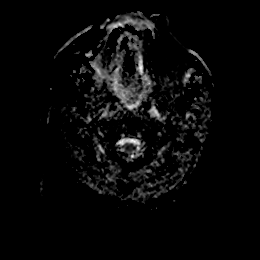
[im 24/47]
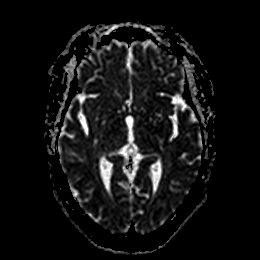
[im 47/47]
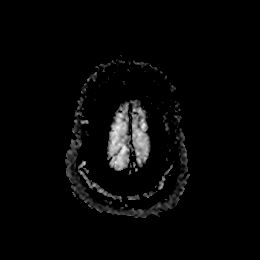

[Series 7: DWI · coronal · 4.0mm · 0.88mm/px · 4 of 68 slices shown (3 of 4)]
[im 1/68]
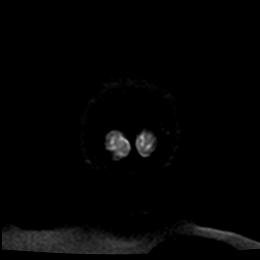
[im 23/68]
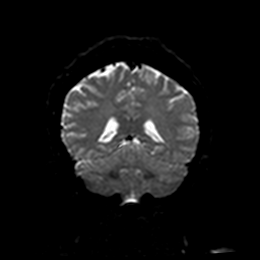
[im 45/68]
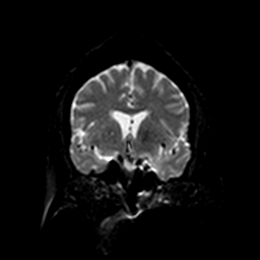
[im 68/68]
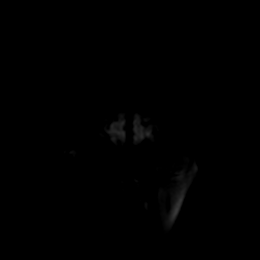

[Series 8: DWI · coronal · 4.0mm · 0.88mm/px · 2 of 34 slices shown (4 of 4)]
[im 1/34]
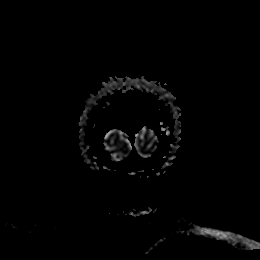
[im 34/34]
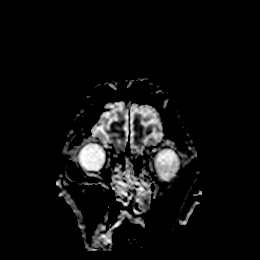

[Series 9: T1 · sagittal · 5.0mm · 0.75mm/px · 1 of 23 slices shown]
[im 1/23]
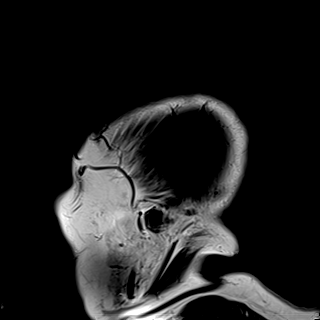

[Series 10: T2 · axial · 5.0mm · 0.72mm/px · z∈[-98,+46]mm · 2 of 25 slices shown (1 of 2)]
[im 1/25]
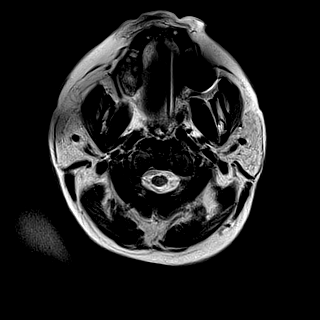
[im 25/25]
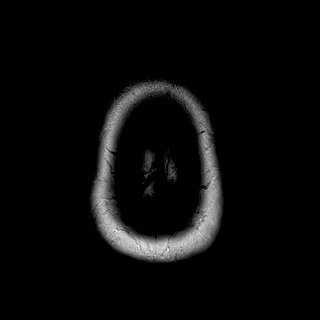

[Series 11: FLAIR · axial · 5.0mm · 0.45mm/px · z∈[-96,+47]mm · 2 of 25 slices shown]
[im 1/25]
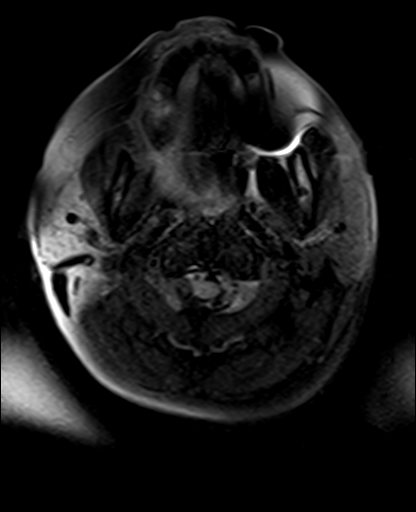
[im 25/25]
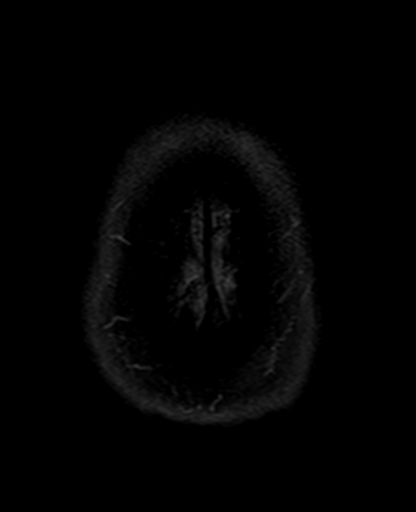

[Series 12: mag_images · axial · 3.0mm · 0.90mm/px · z∈[-103,+73]mm · 4 of 60 slices shown]
[im 1/60]
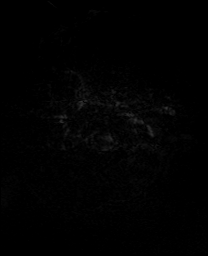
[im 20/60]
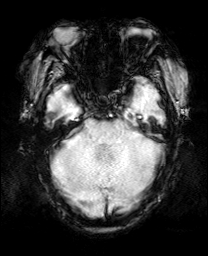
[im 40/60]
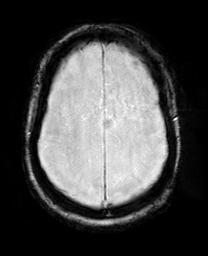
[im 60/60]
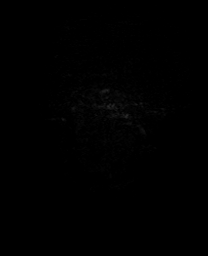

[Series 13: pha_images · axial · 3.0mm · 0.90mm/px · z∈[-97,+73]mm · 4 of 58 slices shown]
[im 1/58]
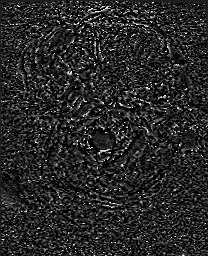
[im 20/58]
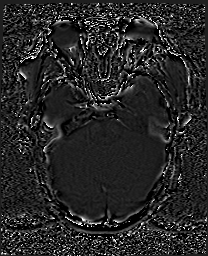
[im 39/58]
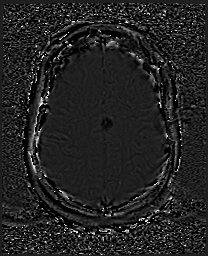
[im 58/58]
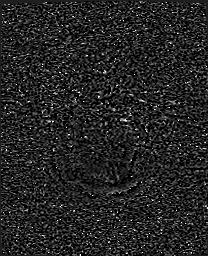

[Series 14: swi_images · axial · 3.0mm · 0.90mm/px · z∈[-103,+73]mm · 4 of 60 slices shown]
[im 1/60]
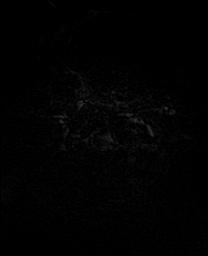
[im 20/60]
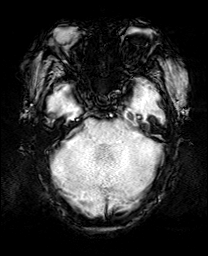
[im 40/60]
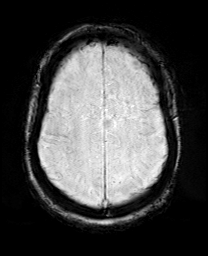
[im 60/60]
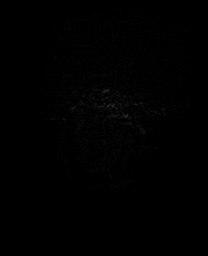

[Series 15: mip_images(sw) · axial · 24.0mm · 0.90mm/px · z∈[-92,+63]mm · 3 of 53 slices shown]
[im 1/53]
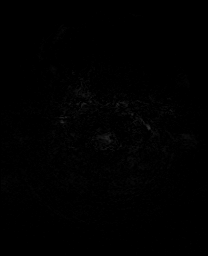
[im 27/53]
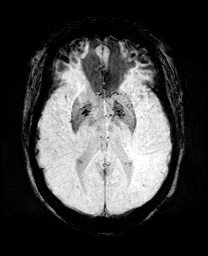
[im 53/53]
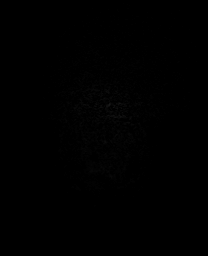

[Series 17: T2 · coronal · 3.0mm · 0.27mm/px · 2 of 30 slices shown (2 of 2)]
[im 1/30]
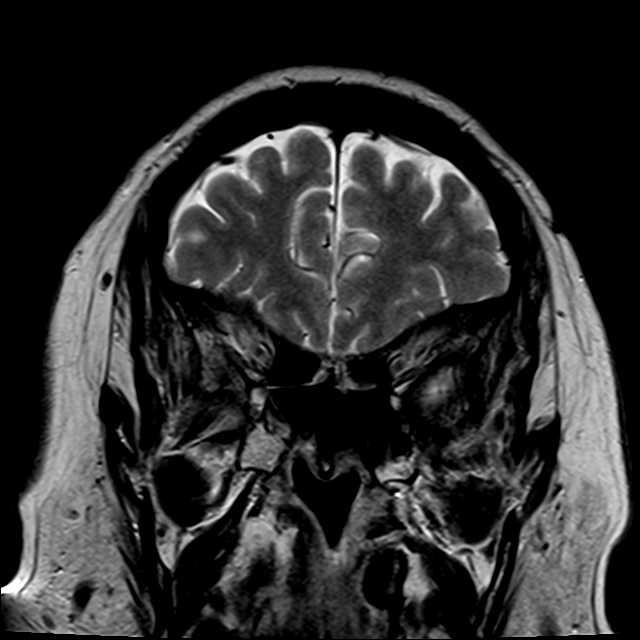
[im 30/30]
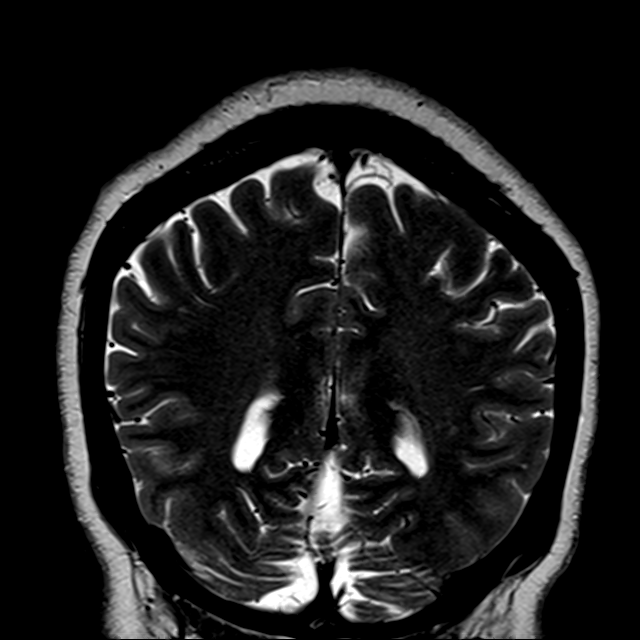

[Series 18: T2 post-contrast · coronal · 5.0mm · 0.72mm/px · 2 of 28 slices shown]
[im 1/28]
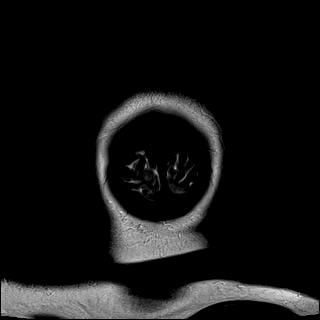
[im 28/28]
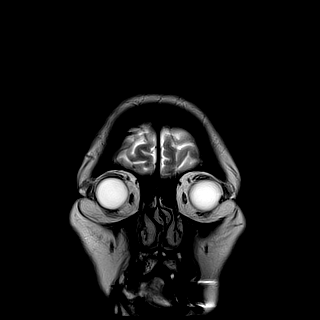

[Series 20: T1 post-contrast · coronal · 5.0mm · 0.34mm/px · 2 of 28 slices shown]
[im 1/28]
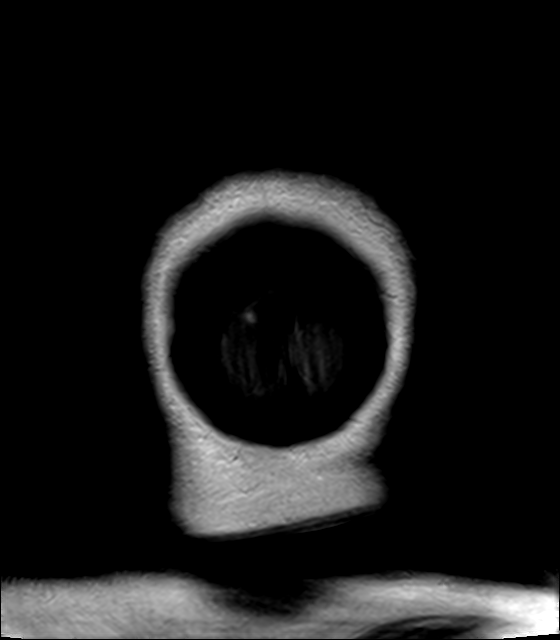
[im 28/28]
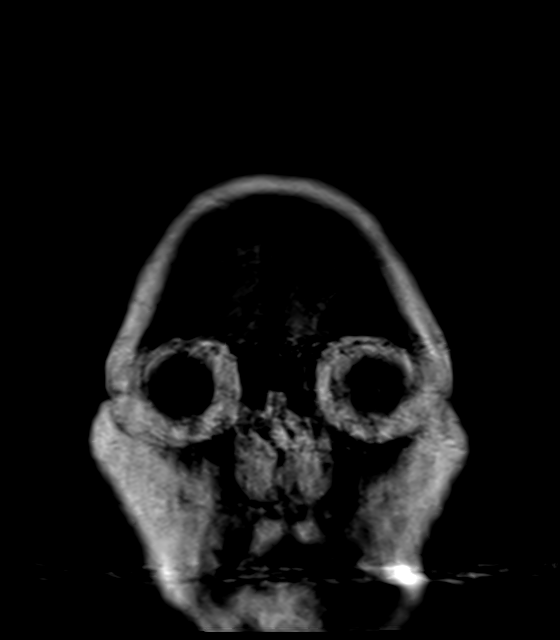

[40 of 48 positions shown; findings below may reference images not displayed]

FINDINGS: BRAIN: There is no acute infarct, acute hemorrhage or extra-axial
collection. Left parafalcine 17 mm densely calcified meningioma. No
midline shift or other mass effect. Mild white matter
hyperintensity, most commonly due to chronic ischemic
microangiopathy, though not unexpected for age. The cerebral and
cerebellar volume are age-appropriate. No hydrocephalus.
Susceptibility-sensitive sequences show no chronic microhemorrhage
or superficial siderosis. No abnormal parenchymal contrast
enhancement

VASCULAR: The major intracranial arterial and venous sinus flow
voids are normal.

SKULL AND UPPER CERVICAL SPINE: Calvarial bone marrow signal is
normal. There is no skull base mass. Visualized upper cervical spine
and soft tissues are normal.

SINUSES/ORBITS: No fluid levels or advanced mucosal thickening.
Left-greater-than-right mastoid fluid. The orbits are normal.
IMPRESSION: 1. No acute intracranial abnormality or specific etiology for
seizure.
2. Densely calcified lesion along the falx cerebri, likely calcified
meningioma. No abnormality of the underlying parenchyma.

## 2023-08-02 DEATH — deceased
